# Patient Record
Sex: Female | Born: 1955 | Race: Black or African American | Hispanic: No | Marital: Single | Smoking: Former smoker
Health system: Southern US, Community
[De-identification: ages and names within clinical notes are randomized; demographics above are authoritative.]

## PROBLEM LIST (undated history)

## (undated) DIAGNOSIS — I10 Essential (primary) hypertension: Secondary | ICD-10-CM

## (undated) DIAGNOSIS — J301 Allergic rhinitis due to pollen: Secondary | ICD-10-CM

## (undated) DIAGNOSIS — J4489 Other specified chronic obstructive pulmonary disease: Secondary | ICD-10-CM

## (undated) DIAGNOSIS — E785 Hyperlipidemia, unspecified: Secondary | ICD-10-CM

## (undated) DIAGNOSIS — T7840XA Allergy, unspecified, initial encounter: Secondary | ICD-10-CM

## (undated) DIAGNOSIS — J4550 Severe persistent asthma, uncomplicated: Secondary | ICD-10-CM

## (undated) DIAGNOSIS — K219 Gastro-esophageal reflux disease without esophagitis: Secondary | ICD-10-CM

## (undated) DIAGNOSIS — D649 Anemia, unspecified: Secondary | ICD-10-CM

## (undated) DIAGNOSIS — J449 Chronic obstructive pulmonary disease, unspecified: Secondary | ICD-10-CM

## (undated) HISTORY — DX: Essential (primary) hypertension: I10

## (undated) HISTORY — DX: Allergy, unspecified, initial encounter: T78.40XA

## (undated) HISTORY — DX: Chronic obstructive pulmonary disease, unspecified: J44.9

## (undated) HISTORY — DX: Anemia, unspecified: D64.9

## (undated) HISTORY — DX: Other specified chronic obstructive pulmonary disease: J44.89

## (undated) HISTORY — PX: TIBIA FRACTURE SURGERY: SHX806

## (undated) HISTORY — DX: Gastro-esophageal reflux disease without esophagitis: K21.9

## (undated) HISTORY — DX: Hyperlipidemia, unspecified: E78.5

## (undated) HISTORY — DX: Severe persistent asthma, uncomplicated: J45.50

## (undated) HISTORY — PX: INTUBATION NASOTRACHEAL: SUR735

## (undated) HISTORY — DX: Allergic rhinitis due to pollen: J30.1

## (undated) HISTORY — PX: EMBOLIZATION: SHX1496

## (undated) HISTORY — PX: FIBULA FRACTURE SURGERY: SHX947

---

## 2014-08-14 DIAGNOSIS — S82209A Unspecified fracture of shaft of unspecified tibia, initial encounter for closed fracture: Secondary | ICD-10-CM | POA: Insufficient documentation

## 2014-08-16 DIAGNOSIS — J4540 Moderate persistent asthma, uncomplicated: Secondary | ICD-10-CM | POA: Insufficient documentation

## 2014-08-16 DIAGNOSIS — K219 Gastro-esophageal reflux disease without esophagitis: Secondary | ICD-10-CM | POA: Insufficient documentation

## 2014-08-17 DIAGNOSIS — S82131A Displaced fracture of medial condyle of right tibia, initial encounter for closed fracture: Secondary | ICD-10-CM | POA: Insufficient documentation

## 2014-08-21 DIAGNOSIS — J45909 Unspecified asthma, uncomplicated: Secondary | ICD-10-CM | POA: Insufficient documentation

## 2014-08-21 DIAGNOSIS — S82872A Displaced pilon fracture of left tibia, initial encounter for closed fracture: Secondary | ICD-10-CM | POA: Insufficient documentation

## 2014-11-29 DIAGNOSIS — S82873B Displaced pilon fracture of unspecified tibia, initial encounter for open fracture type I or II: Secondary | ICD-10-CM | POA: Insufficient documentation

## 2014-11-29 DIAGNOSIS — S82202A Unspecified fracture of shaft of left tibia, initial encounter for closed fracture: Secondary | ICD-10-CM | POA: Insufficient documentation

## 2019-05-27 ENCOUNTER — Other Ambulatory Visit: Payer: Self-pay

## 2019-05-27 ENCOUNTER — Ambulatory Visit (INDEPENDENT_AMBULATORY_CARE_PROVIDER_SITE_OTHER): Payer: Medicare Other | Admitting: Internal Medicine

## 2019-05-27 ENCOUNTER — Ambulatory Visit
Admission: RE | Admit: 2019-05-27 | Discharge: 2019-05-27 | Disposition: A | Discharge status: Home / Self Care | Payer: Medicare Other | Source: Ambulatory Visit | Attending: Internal Medicine | Admitting: Internal Medicine

## 2019-05-27 ENCOUNTER — Encounter: Payer: Self-pay | Admitting: Internal Medicine

## 2019-05-27 VITALS — BP 150/90 | HR 62 | Resp 16 | Ht 64.0 in | Wt 172.0 lb

## 2019-05-27 DIAGNOSIS — J449 Chronic obstructive pulmonary disease, unspecified: Secondary | ICD-10-CM | POA: Diagnosis present

## 2019-05-27 DIAGNOSIS — J301 Allergic rhinitis due to pollen: Secondary | ICD-10-CM

## 2019-05-27 DIAGNOSIS — I10 Essential (primary) hypertension: Secondary | ICD-10-CM

## 2019-05-27 DIAGNOSIS — J4550 Severe persistent asthma, uncomplicated: Secondary | ICD-10-CM

## 2019-05-27 NOTE — Patient Instructions (Signed)
Allergic Rhinitis, Adult Allergic rhinitis is an allergic reaction that affects the mucous membrane inside the nose. It causes sneezing, a runny or stuffy nose, and the feeling of mucus going down the back of the throat (postnasal drip). Allergic rhinitis can be mild to severe. There are two types of allergic rhinitis:  Seasonal. This type is also called hay fever. It happens only during certain seasons.  Perennial. This type can happen at any time of the year. What are the causes? This condition happens when the body's defense system (immune system) responds to certain harmless substances called allergens as though they were germs.  Seasonal allergic rhinitis is triggered by pollen, which can come from grasses, trees, and weeds. Perennial allergic rhinitis may be caused by:  House dust mites.  Pet dander.  Mold spores. What are the signs or symptoms? Symptoms of this condition include:  Sneezing.  Runny or stuffy nose (nasal congestion).  Postnasal drip.  Itchy nose.  Tearing of the eyes.  Trouble sleeping.  Daytime sleepiness. How is this diagnosed? This condition may be diagnosed based on:  Your medical history.  A physical exam.  Tests to check for related conditions, such as: ? Asthma. ? Pink eye. ? Ear infection. ? Upper respiratory infection.  Tests to find out which allergens trigger your symptoms. These may include skin or blood tests. How is this treated? There is no cure for this condition, but treatment can help control symptoms. Treatment may include:  Taking medicines that block allergy symptoms, such as antihistamines. Medicine may be given as a shot, nasal spray, or pill.  Avoiding the allergen.  Desensitization. This treatment involves getting ongoing shots until your body becomes less sensitive to the allergen. This treatment may be done if other treatments do not help.  If taking medicine and avoiding the allergen does not work, new, stronger  medicines may be prescribed. Follow these instructions at home:  Find out what you are allergic to. Common allergens include smoke, dust, and pollen.  Avoid the things you are allergic to. These are some things you can do to help avoid allergens: ? Replace carpet with wood, tile, or vinyl flooring. Carpet can trap dander and dust. ? Do not smoke. Do not allow smoking in your home. ? Change your heating and air conditioning filter at least once a month. ? During allergy season:  Keep windows closed as much as possible.  Plan outdoor activities when pollen counts are lowest. This is usually during the evening hours.  When coming indoors, change clothing and shower before sitting on furniture or bedding.  Take over-the-counter and prescription medicines only as told by your health care provider.  Keep all follow-up visits as told by your health care provider. This is important. Contact a health care provider if:  You have a fever.  You develop a persistent cough.  You make whistling sounds when you breathe (you wheeze).  Your symptoms interfere with your normal daily activities. Get help right away if:  You have shortness of breath. Summary  This condition can be managed by taking medicines as directed and avoiding allergens.  Contact your health care provider if you develop a persistent cough or fever.  During allergy season, keep windows closed as much as possible. This information is not intended to replace advice given to you by your health care provider. Make sure you discuss any questions you have with your health care provider. Document Released: 05/28/2001 Document Revised: 08/15/2017 Document Reviewed: 10/10/2016 Elsevier Patient  Education  [employer]. Asthma, Adult  Asthma is a long-term (chronic) condition in which the airways get tight and narrow. The airways are the breathing passages that lead from the nose and mouth down into the lungs. A person with  asthma will have times when symptoms get worse. These are called asthma attacks. They can cause coughing, whistling sounds when you breathe (wheezing), shortness of breath, and chest pain. They can make it hard to breathe. There is no cure for asthma, but medicines and lifestyle changes can help control it. There are many things that can bring on an asthma attack or make asthma symptoms worse (triggers). Common triggers include:  Mold.  Dust.  Cigarette smoke.  Cockroaches.  Things that can cause allergy symptoms (allergens). These include animal skin flakes (dander) and pollen from trees or grass.  Things that pollute the air. These may include household cleaners, wood smoke, smog, or chemical odors.  Cold air, weather changes, and wind.  Crying or laughing hard.  Stress.  Certain medicines or drugs.  Certain foods such as dried fruit, potato chips, and grape juice.  Infections, such as a cold or the flu.  Certain medical conditions or diseases.  Exercise or tiring activities. Asthma may be treated with medicines and by staying away from the things that cause asthma attacks. Types of medicines may include:  Controller medicines. These help prevent asthma symptoms. They are usually taken every day.  Fast-acting reliever or rescue medicines. These quickly relieve asthma symptoms. They are used as needed and provide short-term relief.  Allergy medicines if your attacks are brought on by allergens.  Medicines to help control the body's defense (immune) system. Follow these instructions at home: Avoiding triggers in your home  Change your heating and air conditioning filter often.  Limit your use of fireplaces and wood stoves.  Get rid of pests (such as roaches and mice) and their droppings.  Throw away plants if you see mold on them.  Clean your floors. Dust regularly. Use cleaning products that do not smell.  Have someone vacuum when you are not home. Use a vacuum  cleaner with a HEPA filter if possible.  Replace carpet with wood, tile, or vinyl flooring. Carpet can trap animal skin flakes and dust.  Use allergy-proof pillows, mattress covers, and box spring covers.  Wash bed sheets and blankets every week in hot water. Dry them in a dryer.  Keep your bedroom free of any triggers.  Avoid pets and keep windows closed when things that cause allergy symptoms are in the air.  Use blankets that are made of polyester or cotton.  Clean bathrooms and kitchens with bleach. If possible, have someone repaint the walls in these rooms with mold-resistant paint. Keep out of the rooms that are being cleaned and painted.  Wash your hands often with soap and water. If soap and water are not available, use hand sanitizer.  Do not allow anyone to smoke in your home. General instructions  Take over-the-counter and prescription medicines only as told by your doctor. ? Talk with your doctor if you have questions about how or when to take your medicines. ? Make note if you need to use your medicines more often than usual.  Do not use any products that contain nicotine or tobacco, such as cigarettes and e-cigarettes. If you need help quitting, ask your doctor.  Stay away from secondhand smoke.  Avoid doing things outdoors when allergen counts are high and when air quality is  low.  Wear a ski mask when doing outdoor activities in the winter. The mask should cover your nose and mouth. Exercise indoors on cold days if you can.  Warm up before you exercise. Take time to cool down after exercise.  Use a peak flow meter as told by your doctor. A peak flow meter is a tool that measures how well the lungs are working.  Keep track of the peak flow meter's readings. Write them down.  Follow your asthma action plan. This is a written plan for taking care of your asthma and treating your attacks.  Make sure you get all the shots (vaccines) that your doctor recommends. Ask  your doctor about a flu shot and a pneumonia shot.  Keep all follow-up visits as told by your doctor. This is important. Contact a doctor if:  You have wheezing, shortness of breath, or a cough even while taking medicine to prevent attacks.  The mucus you cough up (sputum) is thicker than usual.  The mucus you cough up changes from clear or white to yellow, green, gray, or bloody.  You have problems from the medicine you are taking, such as: ? A rash. ? Itching. ? Swelling. ? Trouble breathing.  You need reliever medicines more than 2-3 times a week.  Your peak flow reading is still at 50-79% of your personal best after following the action plan for 1 hour.  You have a fever. Get help right away if:  You seem to be worse and are not responding to medicine during an asthma attack.  You are short of breath even at rest.  You get short of breath when doing very little activity.  You have trouble eating, drinking, or talking.  You have chest pain or tightness.  You have a fast heartbeat.  Your lips or fingernails start to turn blue.  You are light-headed or dizzy, or you faint.  Your peak flow is less than 50% of your personal best.  You feel too tired to breathe normally. Summary  Asthma is a long-term (chronic) condition in which the airways get tight and narrow. An asthma attack can make it hard to breathe.  Asthma cannot be cured, but medicines and lifestyle changes can help control it.  Make sure you understand how to avoid triggers and how and when to use your medicines. This information is not intended to replace advice given to you by your health care provider. Make sure you discuss any questions you have with your health care provider. Document Released: 02/19/2008 Document Revised: 11/05/2018 Document Reviewed: 10/07/2016 Elsevier Patient Education  2020 [employer].

## 2019-05-27 NOTE — Progress Notes (Signed)
[facility] [place], [place] [address]  Pulmonary Sleep Medicine   Office Visit Note  Patient Name: Krista Robinson DOB: 14-Jul-1956 MRN [id number]  Date of Service: 05/27/2019  Complaints/HPI: From [place] goes back and forth. She has history of asthma all her life. She has had admissions to the hospital but last admission was in 2019 and had a attack in the hospital. She was on xolair but feels as though it is not working. Has been tested for allergies in the past. Patient was positive but did not get placed on shots.  Patient has a typical attack with wheezing increasing shortness of breath.  Patient states that most of her symptoms are at nighttime.  The patient states that she has not been recently intubated on the ventilator.  She uses her medications as prescribed and states that she was attempted on trilogy but did not tolerate the trilogy.  Denies chest tightness right now no chest pain noted.  She does not have any increase in sputum production noted at this time.  She has not had any hemoptysis.  No fevers no chills.  No recent episodes of pneumonia.  She states that she has been staying indoors mostly because of the COVID-19 and has already noted she lives in [place] as well as [place] but is thinking eventually she will move down here.  She also does admit to having allergy symptoms with rhinitis  ROS  General: (-) fever, (-) chills, (-) night sweats, (-) weakness Skin: (-) rashes, (-) itching,. Eyes: (-) visual changes, (-) redness, (-) itching. Nose and Sinuses: (-) nasal stuffiness or itchiness, (-) postnasal drip, (-) nosebleeds, (-) sinus trouble. Mouth and Throat: (-) sore throat, (-) hoarseness. Neck: (-) swollen glands, (-) enlarged thyroid, (-) neck pain. Respiratory: + cough, (-) bloody sputum, + shortness of breath, + wheezing. Cardiovascular: - ankle swelling, (-) chest pain. Lymphatic: (-) lymph node enlargement. Neurologic: (-)  numbness, (-) tingling. Psychiatric: (-) anxiety, (-) depression   Current Medication: Outpatient Encounter Medications as of 05/27/2019  Medication Sig  . albuterol (VENTOLIN HFA) 108 (90 Base) MCG/ACT inhaler Inhale 2 puffs into the lungs every 6 (six) hours as needed for wheezing or shortness of breath.  . fluticasone-salmeterol (ADVAIR HFA) 115-21 MCG/ACT inhaler Inhale 2 puffs into the lungs 2 (two) times daily.  Marland Kitchen gabapentin (NEURONTIN) 100 MG capsule Take 100 mg by mouth 2 (two) times daily.  Marland Kitchen gabapentin (NEURONTIN) 600 MG tablet Take 600 mg by mouth at bedtime.  Marland Kitchen levocetirizine (XYZAL) 5 MG tablet Take 5 mg by mouth every evening.  . montelukast (SINGULAIR) 10 MG tablet Take 10 mg by mouth at bedtime.  Marland Kitchen omalizumab Arvid Right) 150 MG/ML prefilled syringe Inject into the skin every 14 (fourteen) days. Unknown dosage  . omeprazole (PRILOSEC) 40 MG capsule Take 40 mg by mouth 2 (two) times daily.  . theophylline (THEO-24) 400 MG 24 hr capsule Take 400 mg by mouth daily.   No facility-administered encounter medications on file as of 05/27/2019.     Surgical History: Past Surgical History:  Procedure Laterality Date  . CESAREAN SECTION    . INTUBATION NASOTRACHEAL     1991 and 1995    Medical History: Past Medical History:  Diagnosis Date  . Allergy   . Anemia   . GERD (gastroesophageal reflux disease)   . Hyperlipidemia     Family History: Family History  Problem Relation Age of Onset  . Uterine cancer Mother   .  Pancreatic cancer Father     Social History: Social History   Socioeconomic History  . Marital status: Single    Spouse name: Not on file  . Number of children: Not on file  . Years of education: Not on file  . Highest education level: Not on file  Occupational History  . Not on file  Social Needs  . Financial resource strain: Not on file  . Food insecurity    Worry: Not on file    Inability: Not on file  . Transportation needs    Medical: Not on  file    Non-medical: Not on file  Tobacco Use  . Smoking status: Former Research scientist (life sciences)  . Smokeless tobacco: Former Network engineer and Sexual Activity  . Alcohol use: Yes    Alcohol/week: 1.0 standard drinks    Types: 1 Glasses of wine per week    Comment: every night   . Drug use: Never  . Sexual activity: Not on file  Lifestyle  . Physical activity    Days per week: Not on file    Minutes per session: Not on file  . Stress: Not on file  Relationships  . Social Herbalist on phone: Not on file    Gets together: Not on file    Attends religious service: Not on file    Active member of club or organization: Not on file    Attends meetings of clubs or organizations: Not on file    Relationship status: Not on file  . Intimate partner violence    Fear of current or ex partner: Not on file    Emotionally abused: Not on file    Physically abused: Not on file    Forced sexual activity: Not on file  Other Topics Concern  . Not on file  Social History Narrative  . Not on file    Vital Signs: Blood pressure (!) 150/90, pulse 62, resp. rate 16, height 5\' 4"  (1.626 m), weight 172 lb (78 kg), SpO2 96 %.  Examination: General Appearance: The patient is well-developed, well-nourished, and in no distress. Skin: Gross inspection of skin unremarkable. Head: normocephalic, no gross deformities. Eyes: no gross deformities noted. ENT: ears appear grossly normal no exudates. Neck: Supple. No thyromegaly. No LAD. Respiratory: no rhonchi noted. Cardiovascular: Normal S1 and S2 without murmur or rub. Extremities: No cyanosis. pulses are equal. Neurologic: Alert and oriented. No involuntary movements.  LABS: No results found for this or any previous visit (from the past 2160 hour(s)).  Radiology: Patient was never admitted.  No results found.  No results found.    Assessment and Plan: Patient Active Problem List   Diagnosis Date Noted  . Asthma, chronic, severe persistent,  uncomplicated   . Seasonal allergic rhinitis due to pollen   . Obstructive chronic bronchitis without exacerbation ([place])   . Essential hypertension     1. Chronic persistant asthma appears to be not well controlled I have discussed with her the treatment plan and she is under agreement.  I did give her a sample of Anoro to try she is basically missing an anticholinergic which can be added.  The other possibilities are also evaluating her for biologic therapy. 2. Allergic Rhinitis she will need to be tested for allergies she currently has a previously documented history of allergies but is not on any allergy shots.  She does have seasonal symptoms.  I we had and ordered the tests 3. COPD she also likely does  have a fixed obstructive defect with her years of having asthma which probably has transitioned into obstructive lung disease. 4. High Blood Pressure no need to see primary care physician this will be set up for her  General Counseling: I have discussed the findings of the evaluation and examination with [name].  I have also discussed any further diagnostic evaluation thatmay be needed or ordered today. Jniah verbalizes understanding of the findings of todays visit. We also reviewed her medications today and discussed drug interactions and side effects including but not limited excessive drowsiness and altered mental states. We also discussed that there is always a risk not just to her but also people around her. she has been encouraged to call the office with any questions or concerns that should arise related to todays visit.    Time spent: 45 minutes  I have personally obtained a history, examined the patient, evaluated laboratory and imaging results, formulated the assessment and plan and placed orders.    Allyne Gee, MD [facility] Pulmonary and Critical Care Sleep medicine

## 2019-05-31 ENCOUNTER — Encounter: Payer: Self-pay | Admitting: Internal Medicine

## 2019-05-31 DIAGNOSIS — J301 Allergic rhinitis due to pollen: Secondary | ICD-10-CM | POA: Insufficient documentation

## 2019-05-31 DIAGNOSIS — J4550 Severe persistent asthma, uncomplicated: Secondary | ICD-10-CM | POA: Insufficient documentation

## 2019-05-31 DIAGNOSIS — J449 Chronic obstructive pulmonary disease, unspecified: Secondary | ICD-10-CM | POA: Insufficient documentation

## 2019-05-31 DIAGNOSIS — I10 Essential (primary) hypertension: Secondary | ICD-10-CM | POA: Insufficient documentation

## 2019-06-04 LAB — ALLERGENS W/TOTAL IGE AREA 8
Alternaria Alternata IgE: <0.1 kU/L
Amer Sycamore IgE Qn: 0.24 kU/L — AB
Aspergillus Fumigatus IgE: <0.1 kU/L
Bermuda Grass IgE: <0.1 kU/L
Cat Dander IgE: 6.23 kU/L — AB
Cedar, Mountain IgE: 0.23 kU/L — AB
Cladosporium Herbarum IgE: <0.1 kU/L
Cockroach, German IgE: 2.7 kU/L — AB
Cottonwood IgE: <0.1 kU/L
D Farinae IgE: >100 kU/L — AB
D Pteronyssinus IgE: >100 kU/L — AB
Dog Dander IgE: 30.9 kU/L — AB
Elm, American IgE: 0.23 kU/L — AB
IgE (Immunoglobulin E), Serum: 1004 IU/mL — ABNORMAL HIGH (ref 6–495)
Maple/Box Elder IgE: <0.1 kU/L
Mouse Urine IgE: 18.3 kU/L — AB
Oak, White IgE: 0.41 kU/L — AB
Pecan, Hickory IgE: 0.15 kU/L — AB
Penicillium Chrysogen IgE: 0.29 kU/L — AB
Pigweed, Rough IgE: 0.29 kU/L — AB
Ragweed, Short IgE: 24.3 kU/L — AB
T010-IgE Walnut: 0.26 kU/L — AB
T015-IgE Ash, White: <0.1 kU/L
Timothy Grass IgE: <0.1 kU/L
W011-IgE Thistle, Russian: <0.1 kU/L
W016-IgE Rough Marshelder: 1.55 kU/L — AB
White Mulberry IgE: 0.24 kU/L — AB

## 2019-06-04 LAB — ALLERGENS W/TOTAL IGE AREA 2
Common Silver Birch IgE: <0.1 kU/L
Johnson Grass IgE: 0.1 kU/L — AB
Sheep Sorrel IgE Qn: <0.1 kU/L

## 2019-06-07 ENCOUNTER — Other Ambulatory Visit: Payer: Self-pay

## 2019-06-07 MED ORDER — MONTELUKAST SODIUM 10 MG PO TABS: 10.0000 mg | ORAL_TABLET | Freq: Every day | ORAL | 3 refills | Status: DC

## 2019-06-09 ENCOUNTER — Ambulatory Visit: Payer: Medicare Other | Admitting: Internal Medicine

## 2019-06-09 ENCOUNTER — Other Ambulatory Visit: Payer: Self-pay

## 2019-06-09 DIAGNOSIS — J4550 Severe persistent asthma, uncomplicated: Secondary | ICD-10-CM

## 2019-06-09 LAB — PULMONARY FUNCTION TEST

## 2019-06-15 NOTE — Procedures (Signed)
[facility] [place], [id number]  DATE OF SERVICE: June 09, 2019  Complete Pulmonary Function Testing Interpretation:  FINDINGS:  The forced vital capacity is normal.  The FEV1 is mildly decreased.  FEV1 FVC ratio is mildly decreased.  Postbronchodilator there is no significant improvement in the FEV1 clinical improvement may still occur in the absence of spirometric improvement.  Total lung capacity is normal residual volume is increased residual volume total lung capacity ratio is increased thoracic gas volume is increased.  The DLCO is mildly decreased.  DLCO corrected for alveolar volume is normal.  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  Allyne Gee, MD [facility] Pulmonary Critical Care Medicine Sleep Medicine

## 2019-06-18 ENCOUNTER — Encounter: Payer: Self-pay | Admitting: Internal Medicine

## 2019-07-05 ENCOUNTER — Other Ambulatory Visit: Payer: Self-pay

## 2019-07-05 ENCOUNTER — Ambulatory Visit: Payer: Medicare Other | Admitting: Internal Medicine

## 2019-07-05 ENCOUNTER — Encounter: Payer: Self-pay | Admitting: Internal Medicine

## 2019-07-05 VITALS — BP 128/84 | HR 71 | Temp 97.3°F | Resp 16 | Ht 64.0 in | Wt 169.0 lb

## 2019-07-05 DIAGNOSIS — J449 Chronic obstructive pulmonary disease, unspecified: Secondary | ICD-10-CM

## 2019-07-05 DIAGNOSIS — I10 Essential (primary) hypertension: Secondary | ICD-10-CM | POA: Diagnosis not present

## 2019-07-05 DIAGNOSIS — J4550 Severe persistent asthma, uncomplicated: Secondary | ICD-10-CM | POA: Diagnosis not present

## 2019-07-05 DIAGNOSIS — J301 Allergic rhinitis due to pollen: Secondary | ICD-10-CM

## 2019-07-05 MED ORDER — TUDORZA PRESSAIR 400 MCG/ACT IN AEPB: INHALATION_SPRAY | RESPIRATORY_TRACT | 3 refills | Status: DC

## 2019-07-05 MED ORDER — ANORO ELLIPTA 62.5-25 MCG/INH IN AEPB: 1.0000 | INHALATION_SPRAY | Freq: Every day | RESPIRATORY_TRACT | 3 refills | Status: DC

## 2019-07-05 MED ORDER — ALBUTEROL SULFATE HFA 108 (90 BASE) MCG/ACT IN AERS: 2.0000 | INHALATION_SPRAY | Freq: Four times a day (QID) | RESPIRATORY_TRACT | 3 refills | Status: DC | PRN

## 2019-07-05 NOTE — Progress Notes (Signed)
[facility] [place], [place] [address]  Pulmonary Sleep Medicine   Office Visit Note  Patient Name: Krista Robinson DOB: 07-05-1956 MRN [id number]  Date of Service: 07/05/2019  Complaints/HPI: She is on anoro and she feels that she is doing much better. She is requesting a script now.  She states her shortness of breath improved denies having any cough or congestion at this time.  No chest pain is noted at this time.  As already noted patient does better with the new inhalers.  ROS  General: (-) fever, (-) chills, (-) night sweats, (-) weakness Skin: (-) rashes, (-) itching,. Eyes: (-) visual changes, (-) redness, (-) itching. Nose and Sinuses: (-) nasal stuffiness or itchiness, (-) postnasal drip, (-) nosebleeds, (-) sinus trouble. Mouth and Throat: (-) sore throat, (-) hoarseness. Neck: (-) swollen glands, (-) enlarged thyroid, (-) neck pain. Respiratory: - cough, (-) bloody sputum, - shortness of breath, - wheezing. Cardiovascular: - ankle swelling, (-) chest pain. Lymphatic: (-) lymph node enlargement. Neurologic: (-) numbness, (-) tingling. Psychiatric: (-) anxiety, (-) depression   Current Medication: Outpatient Encounter Medications as of 07/05/2019  Medication Sig  . albuterol (VENTOLIN HFA) 108 (90 Base) MCG/ACT inhaler Inhale 2 puffs into the lungs every 6 (six) hours as needed for wheezing or shortness of breath.  . fluticasone-salmeterol (ADVAIR HFA) 115-21 MCG/ACT inhaler Inhale 2 puffs into the lungs 2 (two) times daily.  Marland Kitchen gabapentin (NEURONTIN) 100 MG capsule Take 100 mg by mouth 2 (two) times daily.  Marland Kitchen gabapentin (NEURONTIN) 600 MG tablet Take 600 mg by mouth at bedtime.  Marland Kitchen levocetirizine (XYZAL) 5 MG tablet Take 5 mg by mouth every evening.  . montelukast (SINGULAIR) 10 MG tablet Take 1 tablet (10 mg total) by mouth at bedtime.  Marland Kitchen omalizumab Arvid Right) 150 MG/ML prefilled syringe Inject into the skin every 14 (fourteen) days. Unknown  dosage  . omeprazole (PRILOSEC) 40 MG capsule Take 40 mg by mouth 2 (two) times daily.  . theophylline (THEO-24) 400 MG 24 hr capsule Take 400 mg by mouth daily.   No facility-administered encounter medications on file as of 07/05/2019.     Surgical History: Past Surgical History:  Procedure Laterality Date  . CESAREAN SECTION    . INTUBATION NASOTRACHEAL     1991 and 1995    Medical History: Past Medical History:  Diagnosis Date  . Allergy   . Anemia   . Asthma, chronic, severe persistent, uncomplicated   . Essential hypertension   . GERD (gastroesophageal reflux disease)   . Hyperlipidemia   . Obstructive chronic bronchitis without exacerbation ([place])   . Seasonal allergic rhinitis due to pollen     Family History: Family History  Problem Relation Age of Onset  . Uterine cancer Mother   . Pancreatic cancer Father     Social History: Social History   Socioeconomic History  . Marital status: Single    Spouse name: Not on file  . Number of children: Not on file  . Years of education: Not on file  . Highest education level: Not on file  Occupational History  . Not on file  Social Needs  . Financial resource strain: Not on file  . Food insecurity    Worry: Not on file    Inability: Not on file  . Transportation needs    Medical: Not on file    Non-medical: Not on file  Tobacco Use  . Smoking status: Former Research scientist (life sciences)  . Smokeless tobacco: Former Systems developer  Substance and Sexual Activity  . Alcohol use: Yes    Alcohol/week: 1.0 standard drinks    Types: 1 Glasses of wine per week    Comment: every night   . Drug use: Never  . Sexual activity: Not on file  Lifestyle  . Physical activity    Days per week: Not on file    Minutes per session: Not on file  . Stress: Not on file  Relationships  . Social Herbalist on phone: Not on file    Gets together: Not on file    Attends religious service: Not on file    Active member of club or organization: Not on  file    Attends meetings of clubs or organizations: Not on file    Relationship status: Not on file  . Intimate partner violence    Fear of current or ex partner: Not on file    Emotionally abused: Not on file    Physically abused: Not on file    Forced sexual activity: Not on file  Other Topics Concern  . Not on file  Social History Narrative  . Not on file    Vital Signs: Blood pressure 128/84, pulse 71, temperature (!) 97.3 F (36.3 C), resp. rate 16, height 5\' 4"  (1.626 m), weight 169 lb (76.7 kg), SpO2 97 %.  Examination: General Appearance: The patient is well-developed, well-nourished, and in no distress. Skin: Gross inspection of skin unremarkable. Head: normocephalic, no gross deformities. Eyes: no gross deformities noted. ENT: ears appear grossly normal no exudates. Neck: Supple. No thyromegaly. No LAD. Respiratory: no rhonchi noted. Cardiovascular: Normal S1 and S2 without murmur or rub. Extremities: No cyanosis. pulses are equal. Neurologic: Alert and oriented. No involuntary movements.  LABS: Recent Results (from the past 2160 hour(s))  Allergens w/Total IgE Area 2     Status: Abnormal   Collection Time: 06/01/19 11:15 AM  Result Value Ref Range   Johnson Grass IgE 0.10 (A) Class 0/I kU/L   Common Silver Wendee Copp IgE <0.10 Class 0 kU/L   Sheep Sorrel IgE Qn <0.10 Class 0 kU/L  Allergens w/Total IgE Area 8     Status: Abnormal   Collection Time: 06/01/19 11:15 AM  Result Value Ref Range   Class Description Comment     Comment:     Levels of Specific IgE       Class  Description of Class     ---------------------------  -----  --------------------                    < 0.10         0         Negative            0.10 -    0.31         0/I       Equivocal/Low            0.32 -    0.55         I         Low            0.56 -    1.40         II        Moderate            1.41 -    3.90         III       High  3.91 -   19.00         IV        Very High            19.01 -  100.00         V         Very High                   >100.00         VI        Very High    IgE (Immunoglobulin E), Serum 1,004 (H) 6 - 495 IU/mL   D Pteronyssinus IgE >100 (A) Class VI kU/L   D Farinae IgE >100 (A) Class VI kU/L   Cat Dander IgE 6.23 (A) Class IV kU/L   Dog Dander IgE 30.90 (A) Class V kU/L   [place] Grass IgE <0.10 Class 0 kU/L   Timothy Grass IgE <0.10 Class 0 kU/L   Cockroach, German IgE 2.70 (A) Class III kU/L   Penicillium Chrysogen IgE 0.29 (A) Class 0/I kU/L   Cladosporium Herbarum IgE <0.10 Class 0 kU/L   Aspergillus Fumigatus IgE <0.10 Class 0 kU/L   Alternaria Alternata IgE <0.10 Class 0 kU/L   Maple/Box Elder IgE <0.10 Class 0 kU/L   Cedar, [place] IgE 0.23 (A) Class 0/I kU/L   Oak, White IgE 0.41 (A) Class I kU/L   Elm, American IgE 0.23 (A) Class 0/I kU/L   Amer Sycamore IgE Qn 0.24 (A) Class 0/I kU/L   Cottonwood IgE <0.10 Class 0 kU/L   T015-IgE Ash, White <0.10 Class 0 kU/L   T010-IgE Walnut 0.26 (A) Class 0/I kU/L   Pecan, Hickory IgE 0.15 (A) Class 0/I kU/L   White Mulberry IgE 0.24 (A) Class 0/I kU/L   Ragweed, Short IgE 24.30 (A) Class V kU/L   W011-IgE Thistle, [place] <0.10 Class 0 kU/L   Pigweed, Rough IgE 0.29 (A) Class 0/I kU/L   W016-IgE Rough Marshelder 1.55 (A) Class III kU/L   Mouse Urine IgE 18.30 (A) Class IV kU/L    Radiology: Dg Chest 2 View  Result Date: 05/27/2019 CLINICAL DATA:  Shortness of breath and chest tightness for 3 months. EXAM: CHEST - 2 VIEW COMPARISON:  None. FINDINGS: Mildly enlarged cardiac silhouette. Tortuosity of the aorta. There is no evidence of focal airspace consolidation, pleural effusion or pneumothorax. Osseous structures are without acute abnormality. Soft tissues are grossly normal. IMPRESSION: 1. Mildly enlarged cardiac silhouette. 2. Tortuosity of the aorta. 3. No focal consolidation. Electronically Signed   By: Fidela Salisbury M.D.   On: 05/27/2019 16:38    No results  found.  No results found.    Assessment and Plan: Patient Active Problem List   Diagnosis Date Noted  . Asthma, chronic, severe persistent, uncomplicated   . Seasonal allergic rhinitis due to pollen   . Obstructive chronic bronchitis without exacerbation ([place])   . Essential hypertension     1. Ch obstructive asthma we will continue with current treatment regimen.  Seems to be getting benefit as far as MRI is concerned we will continue with supportive care 2. Allergic Rhinitis at baseline continue with present management 3. COPD as above inhaler regimen was adjusted 4. HTN controlled  General Counseling: I have discussed the findings of the evaluation and examination with [name].  I have also discussed any further diagnostic evaluation thatmay be needed or ordered today. Heddy verbalizes understanding of the findings of todays visit. We also  reviewed her medications today and discussed drug interactions and side effects including but not limited excessive drowsiness and altered mental states. We also discussed that there is always a risk not just to her but also people around her. she has been encouraged to call the office with any questions or concerns that should arise related to todays visit.    Time spent: 30min  I have personally obtained a history, examined the patient, evaluated laboratory and imaging results, formulated the assessment and plan and placed orders.    Allyne Gee, MD [facility] Pulmonary and Critical Care Sleep medicine

## 2019-07-05 NOTE — Patient Instructions (Signed)
Asthma, Adult ° °Asthma is a long-term (chronic) condition in which the airways get tight and narrow. The airways are the breathing passages that lead from the nose and mouth down into the lungs. A person with asthma will have times when symptoms get worse. These are called asthma attacks. They can cause coughing, whistling sounds when you breathe (wheezing), shortness of breath, and chest pain. They can make it hard to breathe. There is no cure for asthma, but medicines and lifestyle changes can help control it. °There are many things that can bring on an asthma attack or make asthma symptoms worse (triggers). Common triggers include: °· Mold. °· Dust. °· Cigarette smoke. °· Cockroaches. °· Things that can cause allergy symptoms (allergens). These include animal skin flakes (dander) and pollen from trees or grass. °· Things that pollute the air. These may include household cleaners, wood smoke, smog, or chemical odors. °· Cold air, weather changes, and wind. °· Crying or laughing hard. °· Stress. °· Certain medicines or drugs. °· Certain foods such as dried fruit, potato chips, and grape juice. °· Infections, such as a cold or the flu. °· Certain medical conditions or diseases. °· Exercise or tiring activities. °Asthma may be treated with medicines and by staying away from the things that cause asthma attacks. Types of medicines may include: °· Controller medicines. These help prevent asthma symptoms. They are usually taken every day. °· Fast-acting reliever or rescue medicines. These quickly relieve asthma symptoms. They are used as needed and provide short-term relief. °· Allergy medicines if your attacks are brought on by allergens. °· Medicines to help control the body's defense (immune) system. °Follow these instructions at home: °Avoiding triggers in your home °· Change your heating and air conditioning filter often. °· Limit your use of fireplaces and wood stoves. °· Get rid of pests (such as roaches and  mice) and their droppings. °· Throw away plants if you see mold on them. °· Clean your floors. Dust regularly. Use cleaning products that do not smell. °· Have someone vacuum when you are not home. Use a vacuum cleaner with a HEPA filter if possible. °· Replace carpet with wood, tile, or vinyl flooring. Carpet can trap animal skin flakes and dust. °· Use allergy-proof pillows, mattress covers, and box spring covers. °· Wash bed sheets and blankets every week in hot water. Dry them in a dryer. °· Keep your bedroom free of any triggers. °· Avoid pets and keep windows closed when things that cause allergy symptoms are in the air. °· Use blankets that are made of polyester or cotton. °· Clean bathrooms and kitchens with bleach. If possible, have someone repaint the walls in these rooms with mold-resistant paint. Keep out of the rooms that are being cleaned and painted. °· Wash your hands often with soap and water. If soap and water are not available, use hand sanitizer. °· Do not allow anyone to smoke in your home. °General instructions °· Take over-the-counter and prescription medicines only as told by your doctor. °? Talk with your doctor if you have questions about how or when to take your medicines. °? Make note if you need to use your medicines more often than usual. °· Do not use any products that contain nicotine or tobacco, such as cigarettes and e-cigarettes. If you need help quitting, ask your doctor. °· Stay away from secondhand smoke. °· Avoid doing things outdoors when allergen counts are high and when air quality is low. °· Wear a ski mask   when doing outdoor activities in the winter. The mask should cover your nose and mouth. Exercise indoors on cold days if you can. °· Warm up before you exercise. Take time to cool down after exercise. °· Use a peak flow meter as told by your doctor. A peak flow meter is a tool that measures how well the lungs are working. °· Keep track of the peak flow meter's readings.  Write them down. °· Follow your asthma action plan. This is a written plan for taking care of your asthma and treating your attacks. °· Make sure you get all the shots (vaccines) that your doctor recommends. Ask your doctor about a flu shot and a pneumonia shot. °· Keep all follow-up visits as told by your doctor. This is important. °Contact a doctor if: °· You have wheezing, shortness of breath, or a cough even while taking medicine to prevent attacks. °· The mucus you cough up (sputum) is thicker than usual. °· The mucus you cough up changes from clear or white to yellow, green, gray, or bloody. °· You have problems from the medicine you are taking, such as: °? A rash. °? Itching. °? Swelling. °? Trouble breathing. °· You need reliever medicines more than 2-3 times a week. °· Your peak flow reading is still at 50-79% of your personal best after following the action plan for 1 hour. °· You have a fever. °Get help right away if: °· You seem to be worse and are not responding to medicine during an asthma attack. °· You are short of breath even at rest. °· You get short of breath when doing very little activity. °· You have trouble eating, drinking, or talking. °· You have chest pain or tightness. °· You have a fast heartbeat. °· Your lips or fingernails start to turn blue. °· You are light-headed or dizzy, or you faint. °· Your peak flow is less than 50% of your personal best. °· You feel too tired to breathe normally. °Summary °· Asthma is a long-term (chronic) condition in which the airways get tight and narrow. An asthma attack can make it hard to breathe. °· Asthma cannot be cured, but medicines and lifestyle changes can help control it. °· Make sure you understand how to avoid triggers and how and when to use your medicines. °This information is not intended to replace advice given to you by your health care provider. Make sure you discuss any questions you have with your health care provider. °Document  Released: 02/19/2008 Document Revised: 11/05/2018 Document Reviewed: 10/07/2016 °Elsevier Patient Education © 2020 Elsevier Inc. ° °

## 2019-10-11 ENCOUNTER — Ambulatory Visit: Payer: Medicare Other | Admitting: Internal Medicine

## 2019-10-11 ENCOUNTER — Encounter: Payer: Self-pay | Admitting: Internal Medicine

## 2019-10-11 VITALS — Ht 64.0 in | Wt 163.0 lb

## 2019-10-11 DIAGNOSIS — J449 Chronic obstructive pulmonary disease, unspecified: Secondary | ICD-10-CM | POA: Diagnosis not present

## 2019-10-11 DIAGNOSIS — I10 Essential (primary) hypertension: Secondary | ICD-10-CM

## 2019-10-11 DIAGNOSIS — J4550 Severe persistent asthma, uncomplicated: Secondary | ICD-10-CM

## 2019-10-11 MED ORDER — THEOPHYLLINE ER 400 MG PO CP24: 400.0000 mg | ORAL_CAPSULE | Freq: Every day | ORAL | 1 refills | Status: DC

## 2019-10-11 MED ORDER — MONTELUKAST SODIUM 10 MG PO TABS: 10.0000 mg | ORAL_TABLET | Freq: Every day | ORAL | 2 refills | Status: AC

## 2019-10-11 NOTE — Progress Notes (Signed)
[facility] [place], [place] [address]  Internal MEDICINE  Telephone Visit  Patient Name: Krista Robinson  [id number]  [id number]  Date of Service: 10/11/2019  I connected with the patient at 1118 by telephone and verified the patients identity using two identifiers.   I discussed the limitations, risks, security and privacy concerns of performing an evaluation and management service by telephone and the availability of in person appointments. I also discussed with the patient that there may be a patient responsible charge related to the service.  The patient expressed understanding and agrees to proceed.    Chief Complaint  Patient presents with  . Telephone Assessment    WOULD LIKE TO ASK QUESTION ABOUT GETTING VACCINE  . Telephone Screen  . Asthma  . Medication Refill    SINGULAIR  . Allergies    HPI  PT seen via video.  She is following up on Asthma, allergies and copd.  She reports overall she is doing well.  Her allergy symptoms have increased, however she has been out of her Singulair.  She is requesting refill at this time. She denies any further complaints at this time. She does have questions about getting covid vaccine due to her allergies. I addressed this concerns with her today.    Current Medication: Outpatient Encounter Medications as of 10/11/2019  Medication Sig  . Aclidinium Bromide (TUDORZA PRESSAIR) 400 MCG/ACT AEPB Take 1 inhalation at once a day  . albuterol (VENTOLIN HFA) 108 (90 Base) MCG/ACT inhaler Inhale 2 puffs into the lungs every 6 (six) hours as needed for wheezing or shortness of breath.  . fluticasone-salmeterol (ADVAIR HFA) 115-21 MCG/ACT inhaler Inhale 2 puffs into the lungs 2 (two) times daily.  Marland Kitchen gabapentin (NEURONTIN) 100 MG capsule Take 100 mg by mouth 2 (two) times daily.  Marland Kitchen gabapentin (NEURONTIN) 600 MG tablet Take 600 mg by mouth at bedtime.  Marland Kitchen levocetirizine (XYZAL) 5 MG tablet Take 5 mg by mouth every evening.  .  montelukast (SINGULAIR) 10 MG tablet Take 1 tablet (10 mg total) by mouth at bedtime.  Marland Kitchen omalizumab Arvid Right) 150 MG/ML prefilled syringe Inject into the skin every 14 (fourteen) days. Unknown dosage  . omeprazole (PRILOSEC) 40 MG capsule Take 40 mg by mouth 2 (two) times daily.  . theophylline (THEO-24) 400 MG 24 hr capsule Take 1 capsule (400 mg total) by mouth daily.  . [DISCONTINUED] montelukast (SINGULAIR) 10 MG tablet Take 1 tablet (10 mg total) by mouth at bedtime.  . [DISCONTINUED] theophylline (THEO-24) 400 MG 24 hr capsule Take 400 mg by mouth daily.   No facility-administered encounter medications on file as of 10/11/2019.    Surgical History: Past Surgical History:  Procedure Laterality Date  . CESAREAN SECTION    . INTUBATION NASOTRACHEAL     1991 and 1995    Medical History: Past Medical History:  Diagnosis Date  . Allergy   . Anemia   . Asthma, chronic, severe persistent, uncomplicated   . Essential hypertension   . GERD (gastroesophageal reflux disease)   . Hyperlipidemia   . Obstructive chronic bronchitis without exacerbation ([place])   . Seasonal allergic rhinitis due to pollen     Family History: Family History  Problem Relation Age of Onset  . Uterine cancer Mother   . Pancreatic cancer Father     Social History   Socioeconomic History  . Marital status: Single    Spouse name: Not on file  . Number of children: Not on file  .  Years of education: Not on file  . Highest education level: Not on file  Occupational History  . Not on file  Tobacco Use  . Smoking status: Former Smoker    Types: Cigarettes  . Smokeless tobacco: Never Used  . Tobacco comment: OVER 20 YEAR- REPORTED ON 10/11/19  Substance and Sexual Activity  . Alcohol use: Yes    Alcohol/week: 1.0 standard drinks    Types: 1 Glasses of wine per week    Comment: ONCE IN A WHILE   . Drug use: Never  . Sexual activity: Not on file  Other Topics Concern  . Not on file  Social History  Narrative  . Not on file   Social Determinants of Health   Financial Resource Strain:   . Difficulty of Paying Living Expenses: Not on file  Food Insecurity:   . Worried About Charity fundraiser in the Last Year: Not on file  . Ran Out of Food in the Last Year: Not on file  Transportation Needs:   . Lack of Transportation (Medical): Not on file  . Lack of Transportation (Non-Medical): Not on file  Physical Activity:   . Days of Exercise per Week: Not on file  . Minutes of Exercise per Session: Not on file  Stress:   . Feeling of Stress : Not on file  Social Connections:   . Frequency of Communication with Friends and Family: Not on file  . Frequency of Social Gatherings with Friends and Family: Not on file  . Attends Religious Services: Not on file  . Active Member of Clubs or Organizations: Not on file  . Attends Archivist Meetings: Not on file  . Marital Status: Not on file  Intimate Partner Violence:   . Fear of Current or Ex-Partner: Not on file  . Emotionally Abused: Not on file  . Physically Abused: Not on file  . Sexually Abused: Not on file      Review of Systems  Constitutional: Negative for chills, fatigue and unexpected weight change.  HENT: Negative for congestion, rhinorrhea, sneezing and sore throat.   Eyes: Negative for photophobia, pain and redness.  Respiratory: Negative for cough, chest tightness and shortness of breath.   Cardiovascular: Negative for chest pain and palpitations.  Gastrointestinal: Negative for abdominal pain, constipation, diarrhea, nausea and vomiting.  Endocrine: Negative.   Genitourinary: Negative for dysuria and frequency.  Musculoskeletal: Negative for arthralgias, back pain, joint swelling and neck pain.  Skin: Negative for rash.  Allergic/Immunologic: Negative.   Neurological: Negative for tremors and numbness.  Hematological: Negative for adenopathy. Does not bruise/bleed easily.  Psychiatric/Behavioral: Negative  for behavioral problems and sleep disturbance. The patient is not nervous/anxious.     Vital Signs: Ht 5\' 4"  (1.626 m)   Wt 163 lb (73.9 kg)   BMI 27.98 kg/m    Observation/Objective:  Well appearing, NAD noted.   Assessment/Plan: 1. Chronic obstructive pulmonary disease, unspecified COPD type ([place]) Overall stable, good control of symptoms. Continue inhalers as discussed.  - theophylline (THEO-24) 400 MG 24 hr capsule; Take 1 capsule (400 mg total) by mouth daily.  Dispense: 30 capsule; Refill: 1  2. Asthma, chronic, severe persistent, uncomplicated Overall stable, only takes Theo as needed.  Deferred theo level at this time. Has not had since November.   3. Essential hypertension Stable, continue present management.  General Counseling: hiley hillstrom understanding of the findings of today's phone visit and agrees with plan of treatment. I have discussed any further  diagnostic evaluation that may be needed or ordered today. We also reviewed her medications today. she has been encouraged to call the office with any questions or concerns that should arise related to todays visit.    No orders of the defined types were placed in this encounter.   Meds ordered this encounter  Medications  . montelukast (SINGULAIR) 10 MG tablet    Sig: Take 1 tablet (10 mg total) by mouth at bedtime.    Dispense:  90 tablet    Refill:  2  . theophylline (THEO-24) 400 MG 24 hr capsule    Sig: Take 1 capsule (400 mg total) by mouth daily.    Dispense:  30 capsule    Refill:  1    Time spent: 15 Minutes  This patient was seen by Orson Gear AGNP-C in Collaboration with Dr. Devona Konig as a part of collaborative care agreement.   Orson Gear AGNP-C Pulmonary medicine

## 2019-12-05 ENCOUNTER — Ambulatory Visit: Payer: Medicare Other | Attending: Internal Medicine

## 2019-12-05 DIAGNOSIS — Z23 Encounter for immunization: Secondary | ICD-10-CM

## 2019-12-05 NOTE — Progress Notes (Signed)
   Covid-19 Vaccination Clinic  Name:  Krista Robinson    MRN: [id number] DOB: Feb 14, 1956  12/05/2019  [name] was observed post Covid-19 immunization for 15 minutes without incident. She was provided with Vaccine Information Sheet and instruction to access the V-Safe system.   [name] was instructed to call 911 with any severe reactions post vaccine: Marland Kitchen Difficulty breathing  . Swelling of face and throat  . A fast heartbeat  . A bad rash all over body  . Dizziness and weakness   Immunizations Administered    Name Date Dose VIS Date Route   Pfizer COVID-19 Vaccine 12/05/2019  3:46 PM 0.3 mL 08/27/2019 Intramuscular   Manufacturer: [place]   Lot: [id number]   [place]: [id number]

## 2019-12-24 ENCOUNTER — Other Ambulatory Visit: Payer: Self-pay

## 2019-12-24 MED ORDER — ALBUTEROL SULFATE HFA 108 (90 BASE) MCG/ACT IN AERS: 2.0000 | INHALATION_SPRAY | Freq: Four times a day (QID) | RESPIRATORY_TRACT | 3 refills | Status: DC | PRN

## 2019-12-26 ENCOUNTER — Ambulatory Visit: Payer: Medicare Other | Attending: Internal Medicine

## 2019-12-26 DIAGNOSIS — Z23 Encounter for immunization: Secondary | ICD-10-CM

## 2019-12-26 NOTE — Progress Notes (Signed)
   Covid-19 Vaccination Clinic  Name:  Bismah Weintraub    MRN: [id number] DOB: Jun 13, 1956  12/26/2019  Ms. Sorrells was observed post Covid-19 immunization for 15 minutes   without incident. She was provided with Vaccine Information Sheet and instruction to access the V-Safe system.   Ms. Jenniges was instructed to call 911 with any severe reactions post vaccine: Marland Kitchen Difficulty breathing  . Swelling of face and throat  . A fast heartbeat  . A bad rash all over body  . Dizziness and weakness   Immunizations Administered    Name Date Dose VIS Date Route   Pfizer COVID-19 Vaccine 12/26/2019  3:42 PM 0.3 mL 08/27/2019 Intramuscular   Manufacturer: [place]   Lot: [id number]   [place]: [id number]

## 2020-01-17 ENCOUNTER — Ambulatory Visit: Payer: Medicare Other | Admitting: Internal Medicine

## 2020-01-17 ENCOUNTER — Other Ambulatory Visit: Payer: Self-pay

## 2020-01-17 VITALS — BP 129/78 | HR 74 | Temp 97.2°F | Resp 16 | Ht 64.0 in | Wt 158.0 lb

## 2020-01-17 DIAGNOSIS — J449 Chronic obstructive pulmonary disease, unspecified: Secondary | ICD-10-CM

## 2020-01-17 DIAGNOSIS — I10 Essential (primary) hypertension: Secondary | ICD-10-CM

## 2020-01-17 DIAGNOSIS — J4550 Severe persistent asthma, uncomplicated: Secondary | ICD-10-CM

## 2020-01-17 DIAGNOSIS — J301 Allergic rhinitis due to pollen: Secondary | ICD-10-CM | POA: Diagnosis not present

## 2020-01-17 MED ORDER — THEOPHYLLINE ER 400 MG PO CP24: 400.0000 mg | ORAL_CAPSULE | Freq: Every day | ORAL | 1 refills | Status: AC

## 2020-01-17 MED ORDER — METHYLPREDNISOLONE ACETATE 40 MG/ML IJ SUSP: 40.0000 mg | Freq: Once | INTRAMUSCULAR | Status: DC

## 2020-01-17 MED ORDER — METHYLPREDNISOLONE ACETATE 80 MG/ML IJ SUSP
80.0000 mg | Freq: Once | INTRAMUSCULAR | Status: AC
  Administered 2020-01-17: 40 mg via INTRAMUSCULAR

## 2020-01-17 MED ORDER — PREDNISONE 10 MG PO TABS: ORAL_TABLET | ORAL | 0 refills | Status: DC

## 2020-01-17 NOTE — Progress Notes (Signed)
[facility] [place], [place] [address]  Pulmonary Sleep Medicine   Office Visit Note  Patient Name: Krista Robinson DOB: 23-May-1956 MRN [id number]  Date of Service: 01/17/2020  Complaints/HPI: Pt is here for sick visit.  She reports two days of wheezing.  She has been using budesonide, singulair, xyzal, and theophylline.  She reports it has not stopped for two days.  She feels short of breath.  She reports waking up at night multiple times to use nebulizer. She denies any recent illness, or coughing. She has been moving over the last week and has been a lot of dust.     ROS  General: (-) fever, (-) chills, (-) night sweats, (-) weakness Skin: (-) rashes, (-) itching,. Eyes: (-) visual changes, (-) redness, (-) itching. Nose and Sinuses: (-) nasal stuffiness or itchiness, (-) postnasal drip, (-) nosebleeds, (-) sinus trouble. Mouth and Throat: (-) sore throat, (-) hoarseness. Neck: (-) swollen glands, (-) enlarged thyroid, (-) neck pain. Respiratory: - cough, (-) bloody sputum, + shortness of breath, +wheezing. Cardiovascular: - ankle swelling, (-) chest pain. Lymphatic: (-) lymph node enlargement. Neurologic: (-) numbness, (-) tingling. Psychiatric: (-) anxiety, (-) depression   Current Medication: Outpatient Encounter Medications as of 01/17/2020  Medication Sig  . Aclidinium Bromide (TUDORZA PRESSAIR) 400 MCG/ACT AEPB Take 1 inhalation at once a day  . albuterol (VENTOLIN HFA) 108 (90 Base) MCG/ACT inhaler Inhale 2 puffs into the lungs every 6 (six) hours as needed for wheezing or shortness of breath.  . fluticasone-salmeterol (ADVAIR HFA) 115-21 MCG/ACT inhaler Inhale 2 puffs into the lungs 2 (two) times daily.  Marland Kitchen gabapentin (NEURONTIN) 100 MG capsule Take 100 mg by mouth 2 (two) times daily.  Marland Kitchen gabapentin (NEURONTIN) 600 MG tablet Take 600 mg by mouth at bedtime.  Marland Kitchen levocetirizine (XYZAL) 5 MG tablet Take 5 mg by mouth every evening.  . montelukast  (SINGULAIR) 10 MG tablet Take 1 tablet (10 mg total) by mouth at bedtime.  Marland Kitchen omalizumab Arvid Right) 150 MG/ML prefilled syringe Inject into the skin every 14 (fourteen) days. Unknown dosage  . omeprazole (PRILOSEC) 40 MG capsule Take 40 mg by mouth 2 (two) times daily.  . theophylline (THEO-24) 400 MG 24 hr capsule Take 1 capsule (400 mg total) by mouth daily.  . [DISCONTINUED] theophylline (THEO-24) 400 MG 24 hr capsule Take 1 capsule (400 mg total) by mouth daily.  . predniSONE (DELTASONE) 10 MG tablet Use per dose pack  . [EXPIRED] methylPREDNISolone acetate (DEPO-MEDROL) injection 80 mg   . [DISCONTINUED] methylPREDNISolone acetate (DEPO-MEDROL) injection 40 mg    No facility-administered encounter medications on file as of 01/17/2020.    Surgical History: Past Surgical History:  Procedure Laterality Date  . CESAREAN SECTION    . INTUBATION NASOTRACHEAL     1991 and 1995    Medical History: Past Medical History:  Diagnosis Date  . Allergy   . Anemia   . Asthma, chronic, severe persistent, uncomplicated   . Essential hypertension   . GERD (gastroesophageal reflux disease)   . Hyperlipidemia   . Obstructive chronic bronchitis without exacerbation ([place])   . Seasonal allergic rhinitis due to pollen     Family History: Family History  Problem Relation Age of Onset  . Uterine cancer Mother   . Pancreatic cancer Father     Social History: Social History   Socioeconomic History  . Marital status: Single    Spouse name: Not on file  . Number of children: Not on  file  . Years of education: Not on file  . Highest education level: Not on file  Occupational History  . Not on file  Tobacco Use  . Smoking status: Former Smoker    Types: Cigarettes  . Smokeless tobacco: Never Used  . Tobacco comment: OVER 20 YEAR- REPORTED ON 10/11/19  Substance and Sexual Activity  . Alcohol use: Yes    Alcohol/week: 1.0 standard drinks    Types: 1 Glasses of wine per week    Comment: ONCE  IN A WHILE   . Drug use: Never  . Sexual activity: Not on file  Other Topics Concern  . Not on file  Social History Narrative  . Not on file   Social Determinants of Health   Financial Resource Strain:   . Difficulty of Paying Living Expenses:   Food Insecurity:   . Worried About Charity fundraiser in the Last Year:   . Arboriculturist in the Last Year:   Transportation Needs:   . Film/video editor (Medical):   Marland Kitchen Lack of Transportation (Non-Medical):   Physical Activity:   . Days of Exercise per Week:   . Minutes of Exercise per Session:   Stress:   . Feeling of Stress :   Social Connections:   . Frequency of Communication with Friends and Family:   . Frequency of Social Gatherings with Friends and Family:   . Attends Religious Services:   . Active Member of Clubs or Organizations:   . Attends Archivist Meetings:   Marland Kitchen Marital Status:   Intimate Partner Violence:   . Fear of Current or Ex-Partner:   . Emotionally Abused:   Marland Kitchen Physically Abused:   . Sexually Abused:     Vital Signs: Blood pressure 129/78, pulse 74, temperature (!) 97.2 F (36.2 C), resp. rate 16, height 5\' 4"  (1.626 m), weight 158 lb (71.7 kg), SpO2 97 %.  Examination: General Appearance: The patient is well-developed, well-nourished, and in no distress. Skin: Gross inspection of skin unremarkable. Head: normocephalic, no gross deformities. Eyes: no gross deformities noted. ENT: ears appear grossly normal no exudates. Neck: Supple. No thyromegaly. No LAD. Respiratory: wheezing. Cardiovascular: Normal S1 and S2 without murmur or rub. Extremities: No cyanosis. pulses are equal. Neurologic: Alert and oriented. No involuntary movements.  LABS: No results found for this or any previous visit (from the past 2160 hour(s)).  Radiology: DG Chest 2 View  Result Date: 05/27/2019 CLINICAL DATA:  Shortness of breath and chest tightness for 3 months. EXAM: CHEST - 2 VIEW COMPARISON:  None.  FINDINGS: Mildly enlarged cardiac silhouette. Tortuosity of the aorta. There is no evidence of focal airspace consolidation, pleural effusion or pneumothorax. Osseous structures are without acute abnormality. Soft tissues are grossly normal. IMPRESSION: 1. Mildly enlarged cardiac silhouette. 2. Tortuosity of the aorta. 3. No focal consolidation. Electronically Signed   By: Fidela Salisbury M.D.   On: 05/27/2019 16:38    No results found.  No results found.    Assessment and Plan: Patient Active Problem List   Diagnosis Date Noted  . Asthma, chronic, severe persistent, uncomplicated   . Seasonal allergic rhinitis due to pollen   . Obstructive chronic bronchitis without exacerbation ([place])   . Essential hypertension     1. Asthma, chronic, severe persistent, uncomplicated Asthma Flare likely due to allergies.  Continue medications as prescribed.  Return to office if no improvement, or go to ED.  -Prednisone dose pack.   2. Seasonal  allergic rhinitis due to pollen Continue allergy medications and avoid exposure to pollen as much as possible.   3. Chronic obstructive pulmonary disease, unspecified COPD type (HCC) Continue Theo-24 PRN.  Give solumedrol Im dose in office and will start on prednisone burst pack tomorrow.  - theophylline (THEO-24) 400 MG 24 hr capsule; Take 1 capsule (400 mg total) by mouth daily.  Dispense: 30 capsule; Refill: 1 - methylPREDNISolone acetate (DEPO-MEDROL) injection 80 mg  4. Essential hypertension BP controlled, continue present management.   General Counseling: I have discussed the findings of the evaluation and examination with Krista Robinson.  I have also discussed any further diagnostic evaluation thatmay be needed or ordered today. Krista Robinson verbalizes understanding of the findings of todays visit. We also reviewed her medications today and discussed drug interactions and side effects including but not limited excessive drowsiness and altered mental states. We  also discussed that there is always a risk not just to her but also people around her. she has been encouraged to call the office with any questions or concerns that should arise related to todays visit.  No orders of the defined types were placed in this encounter.  Time spent: 30 This patient was seen by Orson Gear AGNP-C in Collaboration with Dr. Devona Konig as a part of collaborative care agreement.   I have personally obtained a history, examined the patient, evaluated laboratory and imaging results, formulated the assessment and plan and placed orders.    Allyne Gee, MD [facility] Pulmonary and Critical Care Sleep medicine

## 2020-02-01 ENCOUNTER — Telehealth: Payer: Self-pay

## 2020-02-01 NOTE — Telephone Encounter (Signed)
Confirmed appointment on 02/03/2020. klh

## 2020-02-03 ENCOUNTER — Encounter: Payer: Self-pay | Admitting: Internal Medicine

## 2020-02-03 ENCOUNTER — Ambulatory Visit: Payer: Medicare Other | Admitting: Internal Medicine

## 2020-02-03 ENCOUNTER — Other Ambulatory Visit: Payer: Self-pay

## 2020-02-03 VITALS — BP 126/83 | HR 68 | Temp 97.1°F | Resp 16 | Ht 64.0 in | Wt 161.0 lb

## 2020-02-03 DIAGNOSIS — J4550 Severe persistent asthma, uncomplicated: Secondary | ICD-10-CM

## 2020-02-03 DIAGNOSIS — J449 Chronic obstructive pulmonary disease, unspecified: Secondary | ICD-10-CM

## 2020-02-03 DIAGNOSIS — J301 Allergic rhinitis due to pollen: Secondary | ICD-10-CM | POA: Diagnosis not present

## 2020-02-03 DIAGNOSIS — I10 Essential (primary) hypertension: Secondary | ICD-10-CM

## 2020-02-03 MED ORDER — PREDNISONE 10 MG PO TABS: 10.0000 mg | ORAL_TABLET | Freq: Every day | ORAL | 0 refills | Status: DC

## 2020-02-03 NOTE — Progress Notes (Signed)
[facility] [place], [place] [address]  Pulmonary Sleep Medicine   Office Visit Note  Patient Name: Krista Robinson DOB: Oct 27, 1955 MRN [id number]  Date of Service: 02/03/2020  Complaints/HPI: Pt seen for pulmonary follow up.  She was seen for asthma exacerbation on 01/17/20.  She was given steroids and had some relief.  She ended up in urgent care on 01/29/20 for similar symptoms and was given steroids.  She continues to have night time Wheezing and sob.  Also in the morning, and intermittently through out the day.  She had an upper GI 2 years ago, and has been on omeprazole for a few years.      ROS  General: (-) fever, (-) chills, (-) night sweats, (-) weakness Skin: (-) rashes, (-) itching,. Eyes: (-) visual changes, (-) redness, (-) itching. Nose and Sinuses: (-) nasal stuffiness or itchiness, (-) postnasal drip, (-) nosebleeds, (-) sinus trouble. Mouth and Throat: (-) sore throat, (-) hoarseness. Neck: (-) swollen glands, (-) enlarged thyroid, (-) neck pain. Respiratory: - cough, (-) bloody sputum, - shortness of breath, - wheezing. Cardiovascular: - ankle swelling, (-) chest pain. Lymphatic: (-) lymph node enlargement. Neurologic: (-) numbness, (-) tingling. Psychiatric: (-) anxiety, (-) depression   Current Medication: Outpatient Encounter Medications as of 02/03/2020  Medication Sig  . Aclidinium Bromide (TUDORZA PRESSAIR) 400 MCG/ACT AEPB Take 1 inhalation at once a day  . albuterol (VENTOLIN HFA) 108 (90 Base) MCG/ACT inhaler Inhale 2 puffs into the lungs every 6 (six) hours as needed for wheezing or shortness of breath.  . fluticasone-salmeterol (ADVAIR HFA) 115-21 MCG/ACT inhaler Inhale 2 puffs into the lungs 2 (two) times daily.  Marland Kitchen gabapentin (NEURONTIN) 100 MG capsule Take 100 mg by mouth 2 (two) times daily.  Marland Kitchen gabapentin (NEURONTIN) 600 MG tablet Take 600 mg by mouth at bedtime.  Marland Kitchen levocetirizine (XYZAL) 5 MG tablet Take 5 mg by mouth every  evening.  . montelukast (SINGULAIR) 10 MG tablet Take 1 tablet (10 mg total) by mouth at bedtime.  Marland Kitchen omalizumab Arvid Right) 150 MG/ML prefilled syringe Inject into the skin every 14 (fourteen) days. Unknown dosage  . omeprazole (PRILOSEC) 40 MG capsule Take 40 mg by mouth 2 (two) times daily.  . predniSONE (DELTASONE) 10 MG tablet Use per dose pack  . theophylline (THEO-24) 400 MG 24 hr capsule Take 1 capsule (400 mg total) by mouth daily.   No facility-administered encounter medications on file as of 02/03/2020.    Surgical History: Past Surgical History:  Procedure Laterality Date  . CESAREAN SECTION    . INTUBATION NASOTRACHEAL     1991 and 1995    Medical History: Past Medical History:  Diagnosis Date  . Allergy   . Anemia   . Asthma, chronic, severe persistent, uncomplicated   . Essential hypertension   . GERD (gastroesophageal reflux disease)   . Hyperlipidemia   . Obstructive chronic bronchitis without exacerbation ([place])   . Seasonal allergic rhinitis due to pollen     Family History: Family History  Problem Relation Age of Onset  . Uterine cancer Mother   . Pancreatic cancer Father     Social History: Social History   Socioeconomic History  . Marital status: Single    Spouse name: Not on file  . Number of children: Not on file  . Years of education: Not on file  . Highest education level: Not on file  Occupational History  . Not on file  Tobacco Use  . Smoking  status: Former Smoker    Types: Cigarettes  . Smokeless tobacco: Never Used  . Tobacco comment: OVER 20 YEAR- REPORTED ON 10/11/19  Substance and Sexual Activity  . Alcohol use: Not Currently    Alcohol/week: 1.0 standard drinks    Types: 1 Glasses of wine per week  . Drug use: Never  . Sexual activity: Not on file  Other Topics Concern  . Not on file  Social History Narrative  . Not on file   Social Determinants of Health   Financial Resource Strain:   . Difficulty of Paying Living  Expenses:   Food Insecurity:   . Worried About Charity fundraiser in the Last Year:   . Arboriculturist in the Last Year:   Transportation Needs:   . Film/video editor (Medical):   Marland Kitchen Lack of Transportation (Non-Medical):   Physical Activity:   . Days of Exercise per Week:   . Minutes of Exercise per Session:   Stress:   . Feeling of Stress :   Social Connections:   . Frequency of Communication with Friends and Family:   . Frequency of Social Gatherings with Friends and Family:   . Attends Religious Services:   . Active Member of Clubs or Organizations:   . Attends Archivist Meetings:   Marland Kitchen Marital Status:   Intimate Partner Violence:   . Fear of Current or Ex-Partner:   . Emotionally Abused:   Marland Kitchen Physically Abused:   . Sexually Abused:     Vital Signs: Blood pressure 126/83, pulse 68, temperature (!) 97.1 F (36.2 C), resp. rate 16, height 5\' 4"  (1.626 m), weight 161 lb (73 kg), SpO2 94 %.  Examination: General Appearance: The patient is well-developed, well-nourished, and in no distress. Skin: Gross inspection of skin unremarkable. Head: normocephalic, no gross deformities. Eyes: no gross deformities noted. ENT: ears appear grossly normal no exudates. Neck: Supple. No thyromegaly. No LAD. Respiratory: clear bilaterally. Cardiovascular: Normal S1 and S2 without murmur or rub. Extremities: No cyanosis. pulses are equal. Neurologic: Alert and oriented. No involuntary movements.  LABS: No results found for this or any previous visit (from the past 2160 hour(s)).  Radiology: DG Chest 2 View  Result Date: 05/27/2019 CLINICAL DATA:  Shortness of breath and chest tightness for 3 months. EXAM: CHEST - 2 VIEW COMPARISON:  None. FINDINGS: Mildly enlarged cardiac silhouette. Tortuosity of the aorta. There is no evidence of focal airspace consolidation, pleural effusion or pneumothorax. Osseous structures are without acute abnormality. Soft tissues are grossly  normal. IMPRESSION: 1. Mildly enlarged cardiac silhouette. 2. Tortuosity of the aorta. 3. No focal consolidation. Electronically Signed   By: Fidela Salisbury M.D.   On: 05/27/2019 16:38    No results found.  No results found.    Assessment and Plan: Patient Active Problem List   Diagnosis Date Noted  . Asthma, chronic, severe persistent, uncomplicated   . Seasonal allergic rhinitis due to pollen   . Obstructive chronic bronchitis without exacerbation ([place])   . Essential hypertension     1. Asthma, chronic, severe persistent, uncomplicated Prednisone provided for PRN basis, patient will be traveling back to [place].  Sample of 200mg  trelegy given to patient also.  - predniSONE (DELTASONE) 10 MG tablet; Take 1 tablet (10 mg total) by mouth daily with breakfast.  Dispense: 60 tablet; Refill: 0  2. Seasonal allergic rhinitis due to pollen Continue montelukast as prescribed.   3. Chronic obstructive pulmonary disease, unspecified COPD type (  [place]) Continue to monitor.  4. Essential hypertension Well controlled, continue current management.   General Counseling: I have discussed the findings of the evaluation and examination with Krista Robinson.  I have also discussed any further diagnostic evaluation thatmay be needed or ordered today. Krista Robinson verbalizes understanding of the findings of todays visit. We also reviewed her medications today and discussed drug interactions and side effects including but not limited excessive drowsiness and altered mental states. We also discussed that there is always a risk not just to her but also people around her. she has been encouraged to call the office with any questions or concerns that should arise related to todays visit.  No orders of the defined types were placed in this encounter.    Time spent:  30 This patient was seen by Orson Gear AGNP-C in Collaboration with Dr. Devona Konig as a part of collaborative care agreement.   I have personally  obtained a history, examined the patient, evaluated laboratory and imaging results, formulated the assessment and plan and placed orders.    Allyne Gee, MD [facility] Pulmonary and Critical Care Sleep medicine

## 2020-02-17 ENCOUNTER — Ambulatory Visit: Payer: Medicare Other | Admitting: Internal Medicine

## 2020-02-29 ENCOUNTER — Telehealth: Payer: Self-pay

## 2020-02-29 NOTE — Telephone Encounter (Signed)
Patient rescheduled on 03/02/2020 to 03/28/2020. klh

## 2020-03-02 ENCOUNTER — Ambulatory Visit: Payer: Medicare Other | Admitting: Internal Medicine

## 2020-03-24 ENCOUNTER — Telehealth: Payer: Self-pay

## 2020-03-24 NOTE — Telephone Encounter (Signed)
Confirmed appointment on 03/28/2020 and screened for covid. klh 

## 2020-03-28 ENCOUNTER — Ambulatory Visit: Payer: Medicare Other | Admitting: Internal Medicine

## 2020-03-28 ENCOUNTER — Encounter: Payer: Self-pay | Admitting: Internal Medicine

## 2020-03-28 ENCOUNTER — Other Ambulatory Visit: Payer: Self-pay

## 2020-03-28 VITALS — BP 126/80 | HR 63 | Temp 97.0°F | Resp 16 | Ht 64.0 in | Wt 167.0 lb

## 2020-03-28 DIAGNOSIS — J4550 Severe persistent asthma, uncomplicated: Secondary | ICD-10-CM | POA: Diagnosis not present

## 2020-03-28 DIAGNOSIS — I10 Essential (primary) hypertension: Secondary | ICD-10-CM | POA: Diagnosis not present

## 2020-03-28 DIAGNOSIS — J301 Allergic rhinitis due to pollen: Secondary | ICD-10-CM

## 2020-03-28 NOTE — Patient Instructions (Signed)
Asthma, Adult  Asthma is a long-term (chronic) condition in which the airways get tight and narrow. The airways are the breathing passages that lead from the nose and mouth down into the lungs. A person with asthma will have times when symptoms get worse. These are called asthma attacks. They can cause coughing, whistling sounds when you breathe (wheezing), shortness of breath, and chest pain. They can make it hard to breathe. There is no cure for asthma, but medicines and lifestyle changes can help control it. There are many things that can bring on an asthma attack or make asthma symptoms worse (triggers). Common triggers include:  Mold.  Dust.  Cigarette smoke.  Cockroaches.  Things that can cause allergy symptoms (allergens). These include animal skin flakes (dander) and pollen from trees or grass.  Things that pollute the air. These may include household cleaners, wood smoke, smog, or chemical odors.  Cold air, weather changes, and wind.  Crying or laughing hard.  Stress.  Certain medicines or drugs.  Certain foods such as dried fruit, potato chips, and grape juice.  Infections, such as a cold or the flu.  Certain medical conditions or diseases.  Exercise or tiring activities. Asthma may be treated with medicines and by staying away from the things that cause asthma attacks. Types of medicines may include:  Controller medicines. These help prevent asthma symptoms. They are usually taken every day.  Fast-acting reliever or rescue medicines. These quickly relieve asthma symptoms. They are used as needed and provide short-term relief.  Allergy medicines if your attacks are brought on by allergens.  Medicines to help control the body's defense (immune) system. Follow these instructions at home: Avoiding triggers in your home  Change your heating and air conditioning filter often.  Limit your use of fireplaces and wood stoves.  Get rid of pests (such as roaches and  mice) and their droppings.  Throw away plants if you see mold on them.  Clean your floors. Dust regularly. Use cleaning products that do not smell.  Have someone vacuum when you are not home. Use a vacuum cleaner with a HEPA filter if possible.  Replace carpet with wood, tile, or vinyl flooring. Carpet can trap animal skin flakes and dust.  Use allergy-proof pillows, mattress covers, and box spring covers.  Wash bed sheets and blankets every week in hot water. Dry them in a dryer.  Keep your bedroom free of any triggers.  Avoid pets and keep windows closed when things that cause allergy symptoms are in the air.  Use blankets that are made of polyester or cotton.  Clean bathrooms and kitchens with bleach. If possible, have someone repaint the walls in these rooms with mold-resistant paint. Keep out of the rooms that are being cleaned and painted.  Wash your hands often with soap and water. If soap and water are not available, use hand sanitizer.  Do not allow anyone to smoke in your home. General instructions  Take over-the-counter and prescription medicines only as told by your doctor. ? Talk with your doctor if you have questions about how or when to take your medicines. ? Make note if you need to use your medicines more often than usual.  Do not use any products that contain nicotine or tobacco, such as cigarettes and e-cigarettes. If you need help quitting, ask your doctor.  Stay away from secondhand smoke.  Avoid doing things outdoors when allergen counts are high and when air quality is low.  Wear a ski mask   when doing outdoor activities in the winter. The mask should cover your nose and mouth. Exercise indoors on cold days if you can.  Warm up before you exercise. Take time to cool down after exercise.  Use a peak flow meter as told by your doctor. A peak flow meter is a tool that measures how well the lungs are working.  Keep track of the peak flow meter's readings.  Write them down.  Follow your asthma action plan. This is a written plan for taking care of your asthma and treating your attacks.  Make sure you get all the shots (vaccines) that your doctor recommends. Ask your doctor about a flu shot and a pneumonia shot.  Keep all follow-up visits as told by your doctor. This is important. Contact a doctor if:  You have wheezing, shortness of breath, or a cough even while taking medicine to prevent attacks.  The mucus you cough up (sputum) is thicker than usual.  The mucus you cough up changes from clear or white to yellow, green, gray, or bloody.  You have problems from the medicine you are taking, such as: ? A rash. ? Itching. ? Swelling. ? Trouble breathing.  You need reliever medicines more than 2-3 times a week.  Your peak flow reading is still at 50-79% of your personal best after following the action plan for 1 hour.  You have a fever. Get help right away if:  You seem to be worse and are not responding to medicine during an asthma attack.  You are short of breath even at rest.  You get short of breath when doing very little activity.  You have trouble eating, drinking, or talking.  You have chest pain or tightness.  You have a fast heartbeat.  Your lips or fingernails start to turn blue.  You are light-headed or dizzy, or you faint.  Your peak flow is less than 50% of your personal best.  You feel too tired to breathe normally. Summary  Asthma is a long-term (chronic) condition in which the airways get tight and narrow. An asthma attack can make it hard to breathe.  Asthma cannot be cured, but medicines and lifestyle changes can help control it.  Make sure you understand how to avoid triggers and how and when to use your medicines. This information is not intended to replace advice given to you by your health care provider. Make sure you discuss any questions you have with your health care provider. Document Revised:  11/05/2018 Document Reviewed: 10/07/2016 Elsevier Patient Education  2020 [employer]  

## 2020-03-28 NOTE — Progress Notes (Signed)
[facility] [place], [place] [address]  Pulmonary Sleep Medicine   Office Visit Note  Patient Name: Krista Robinson DOB: 03/25/1956 MRN [id number]  Date of Service: 03/28/2020  Complaints/HPI: Asthma.  She has had multiple issues with allergies and with asthma.  I had recommended that she get allergy testing which she did however she did not go on shots.  Patient is also being seen in [place] when she goes up there for her asthma and allergies.  She had the same allergy test done up in [place] which basically confirmed the same thing that we confirmed here I have reviewed the allergy test with her I strongly recommend that she try allergy shots.  In addition she stated that she stopped taking her Xolair her IgE level was actually greater now than it was previously in [place].  I think she may be best served seeing an allergist and immunologist to help her with her regimen at this point.  ROS  General: (-) fever, (-) chills, (-) night sweats, (-) weakness Skin: (-) rashes, (-) itching,. Eyes: (-) visual changes, (-) redness, (-) itching. Nose and Sinuses: (-) nasal stuffiness or itchiness, (-) postnasal drip, (-) nosebleeds, (-) sinus trouble. Mouth and Throat: (-) sore throat, (-) hoarseness. Neck: (-) swollen glands, (-) enlarged thyroid, (-) neck pain. Respiratory: + cough, (-) bloody sputum, + shortness of breath, + wheezing. Cardiovascular: - ankle swelling, (-) chest pain. Lymphatic: (-) lymph node enlargement. Neurologic: (-) numbness, (-) tingling. Psychiatric: (-) anxiety, (-) depression   Current Medication: Outpatient Encounter Medications as of 03/28/2020  Medication Sig  . Aclidinium Bromide (TUDORZA PRESSAIR) 400 MCG/ACT AEPB Take 1 inhalation at once a day  . albuterol (VENTOLIN HFA) 108 (90 Base) MCG/ACT inhaler Inhale 2 puffs into the lungs every 6 (six) hours as needed for wheezing or shortness of breath.  Marland Kitchen atorvastatin (LIPITOR) 20 MG  tablet Take 20 mg by mouth daily.  Marland Kitchen gabapentin (NEURONTIN) 100 MG capsule Take 100 mg by mouth 2 (two) times daily.  Marland Kitchen gabapentin (NEURONTIN) 600 MG tablet Take 600 mg by mouth at bedtime.  Marland Kitchen levocetirizine (XYZAL) 5 MG tablet Take 5 mg by mouth every evening.  . montelukast (SINGULAIR) 10 MG tablet Take 1 tablet (10 mg total) by mouth at bedtime.  Marland Kitchen omeprazole (PRILOSEC) 40 MG capsule Take 40 mg by mouth 2 (two) times daily.  . theophylline (THEO-24) 400 MG 24 hr capsule Take 1 capsule (400 mg total) by mouth daily.  . fluticasone-salmeterol (ADVAIR HFA) 115-21 MCG/ACT inhaler Inhale 2 puffs into the lungs 2 (two) times daily. (Patient not taking: Reported on 03/28/2020)  . predniSONE (DELTASONE) 10 MG tablet Take 1 tablet (10 mg total) by mouth daily with breakfast. (Patient not taking: Reported on 03/28/2020)   No facility-administered encounter medications on file as of 03/28/2020.    Surgical History: Past Surgical History:  Procedure Laterality Date  . CESAREAN SECTION    . INTUBATION NASOTRACHEAL     1991 and 1995    Medical History: Past Medical History:  Diagnosis Date  . Allergy   . Anemia   . Asthma, chronic, severe persistent, uncomplicated   . Essential hypertension   . GERD (gastroesophageal reflux disease)   . Hyperlipidemia   . Obstructive chronic bronchitis without exacerbation ([place])   . Seasonal allergic rhinitis due to pollen     Family History: Family History  Problem Relation Age of Onset  . Uterine cancer Mother   .  Pancreatic cancer Father     Social History: Social History   Socioeconomic History  . Marital status: Single    Spouse name: Not on file  . Number of children: Not on file  . Years of education: Not on file  . Highest education level: Not on file  Occupational History  . Not on file  Tobacco Use  . Smoking status: Former Smoker    Types: Cigarettes  . Smokeless tobacco: Never Used  . Tobacco comment: OVER 20 YEAR- REPORTED ON  10/11/19  Substance and Sexual Activity  . Alcohol use: Not Currently    Alcohol/week: 1.0 standard drink    Types: 1 Glasses of wine per week  . Drug use: Never  . Sexual activity: Not on file  Other Topics Concern  . Not on file  Social History Narrative  . Not on file   Social Determinants of Health   Financial Resource Strain:   . Difficulty of Paying Living Expenses:   Food Insecurity:   . Worried About Charity fundraiser in the Last Year:   . Arboriculturist in the Last Year:   Transportation Needs:   . Film/video editor (Medical):   Marland Kitchen Lack of Transportation (Non-Medical):   Physical Activity:   . Days of Exercise per Week:   . Minutes of Exercise per Session:   Stress:   . Feeling of Stress :   Social Connections:   . Frequency of Communication with Friends and Family:   . Frequency of Social Gatherings with Friends and Family:   . Attends Religious Services:   . Active Member of Clubs or Organizations:   . Attends Archivist Meetings:   Marland Kitchen Marital Status:   Intimate Partner Violence:   . Fear of Current or Ex-Partner:   . Emotionally Abused:   Marland Kitchen Physically Abused:   . Sexually Abused:     Vital Signs: Blood pressure 126/80, pulse 63, temperature (!) 97 F (36.1 C), resp. rate 16, height 5\' 4"  (1.626 m), weight 167 lb (75.8 kg), SpO2 97 %.  Examination: General Appearance: The patient is well-developed, well-nourished, and in no distress. Skin: Gross inspection of skin unremarkable. Head: normocephalic, no gross deformities. Eyes: no gross deformities noted. ENT: ears appear grossly normal no exudates. Neck: Supple. No thyromegaly. No LAD. Respiratory: Few scattered rhonchi are noted. Cardiovascular: Normal S1 and S2 without murmur or rub. Extremities: No cyanosis. pulses are equal. Neurologic: Alert and oriented. No involuntary movements.  LABS: No results found for this or any previous visit (from the past 2160  hour(s)).  Radiology: DG Chest 2 View  Result Date: 05/27/2019 CLINICAL DATA:  Shortness of breath and chest tightness for 3 months. EXAM: CHEST - 2 VIEW COMPARISON:  None. FINDINGS: Mildly enlarged cardiac silhouette. Tortuosity of the aorta. There is no evidence of focal airspace consolidation, pleural effusion or pneumothorax. Osseous structures are without acute abnormality. Soft tissues are grossly normal. IMPRESSION: 1. Mildly enlarged cardiac silhouette. 2. Tortuosity of the aorta. 3. No focal consolidation. Electronically Signed   By: Fidela Salisbury M.D.   On: 05/27/2019 16:38    No results found.  No results found.    Assessment and Plan: Patient Active Problem List   Diagnosis Date Noted  . Asthma, chronic, severe persistent, uncomplicated   . Seasonal allergic rhinitis due to pollen   . Obstructive chronic bronchitis without exacerbation ([place])   . Essential hypertension     1. Chronic Obstructive asthma she  has allergic asthma with elevated IgE positive allergy test.  I recommended that she go see an allergist we will go ahead and make a referral today.  I think she needs to go back on her anti-IgE medication as well as consider allergy shots.  I will defer this to the allergist and immunologist. 2. Allergic Rhinitis Pollen she had pretty significant changes noted on the status also I think slightly worsening of her allergy status compared to the previous testing she had done up in [place] so we will refer to the allergist. 3. HTN this should be worked up with control per her primary care physician  General Counseling: I have discussed the findings of the evaluation and examination with [name].  I have also discussed any further diagnostic evaluation thatmay be needed or ordered today. Angeliki verbalizes understanding of the findings of todays visit. We also reviewed her medications today and discussed drug interactions and side effects including but not limited excessive  drowsiness and altered mental states. We also discussed that there is always a risk not just to her but also people around her. she has been encouraged to call the office with any questions or concerns that should arise related to todays visit.  Orders Placed This Encounter  Procedures  . Ambulatory referral to Allergy    Referral Priority:   Routine    Referral Type:   Allergy Testing    Referral Reason:   Specialty Services Required    Requested Specialty:   Allergy    Number of Visits Requested:   1     Time spent: 35 minutes  I have personally obtained a history, examined the patient, evaluated laboratory and imaging results, formulated the assessment and plan and placed orders.    Allyne Gee, MD [facility] Pulmonary and Critical Care Sleep medicine

## 2020-05-14 ENCOUNTER — Other Ambulatory Visit: Payer: Self-pay | Admitting: Internal Medicine

## 2020-09-01 ENCOUNTER — Ambulatory Visit: Payer: Medicare Other | Admitting: Podiatry

## 2020-09-01 ENCOUNTER — Encounter: Payer: Self-pay | Admitting: Podiatry

## 2020-09-01 ENCOUNTER — Other Ambulatory Visit: Payer: Self-pay

## 2020-09-01 ENCOUNTER — Ambulatory Visit (INDEPENDENT_AMBULATORY_CARE_PROVIDER_SITE_OTHER): Payer: Medicare Other

## 2020-09-01 DIAGNOSIS — M779 Enthesopathy, unspecified: Secondary | ICD-10-CM

## 2020-09-01 DIAGNOSIS — M778 Other enthesopathies, not elsewhere classified: Secondary | ICD-10-CM | POA: Diagnosis not present

## 2020-09-01 MED ORDER — BETAMETHASONE SOD PHOS & ACET 6 (3-3) MG/ML IJ SUSP: 3.0000 mg | Freq: Once | INTRAMUSCULAR | Status: DC

## 2020-09-01 MED ORDER — MELOXICAM 15 MG PO TABS: 15.0000 mg | ORAL_TABLET | Freq: Every day | ORAL | 1 refills | Status: DC
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
## 2020-09-25 ENCOUNTER — Ambulatory Visit: Payer: Medicare Other | Admitting: Hospice and Palliative Medicine

## 2020-09-25 ENCOUNTER — Encounter: Payer: Self-pay | Admitting: Internal Medicine

## 2020-09-25 ENCOUNTER — Other Ambulatory Visit: Payer: Self-pay

## 2020-09-25 VITALS — BP 116/62 | HR 70 | Temp 98.0°F | Resp 16 | Ht 64.0 in | Wt 172.0 lb

## 2020-09-25 DIAGNOSIS — J4550 Severe persistent asthma, uncomplicated: Secondary | ICD-10-CM | POA: Diagnosis not present

## 2020-09-25 DIAGNOSIS — J301 Allergic rhinitis due to pollen: Secondary | ICD-10-CM

## 2020-09-25 DIAGNOSIS — R0602 Shortness of breath: Secondary | ICD-10-CM | POA: Diagnosis not present

## 2020-09-25 MED ORDER — TUDORZA PRESSAIR 400 MCG/ACT IN AEPB: INHALATION_SPRAY | RESPIRATORY_TRACT | 3 refills | Status: DC

## 2020-09-25 NOTE — Progress Notes (Deleted)
[facility] [place], [place] [address]  Pulmonary Sleep Medicine   Office Visit Note  Patient Name: Krista Robinson DOB: 06/05/1956 MRN [id number]  Date of Service: 09/25/2020  Complaints/HPI: ***  ROS  General: (-) fever, (-) chills, (-) night sweats, (-) weakness Skin: (-) rashes, (-) itching,. Eyes: (-) visual changes, (-) redness, (-) itching. Nose and Sinuses: (-) nasal stuffiness or itchiness, (-) postnasal drip, (-) nosebleeds, (-) sinus trouble. Mouth and Throat: (-) sore throat, (-) hoarseness. Neck: (-) swollen glands, (-) enlarged thyroid, (-) neck pain. Respiratory: *** cough, (-) bloody sputum, *** shortness of breath, *** wheezing. Cardiovascular: *** ankle swelling, (-) chest pain. Lymphatic: (-) lymph node enlargement. Neurologic: (-) numbness, (-) tingling. Psychiatric: (-) anxiety, (-) depression   Current Medication: Outpatient Encounter Medications as of 09/25/2020  Medication Sig  . Aclidinium Bromide (TUDORZA PRESSAIR) 400 MCG/ACT AEPB Take 1 inhalation at once a day  . albuterol (VENTOLIN HFA) 108 (90 Base) MCG/ACT inhaler TAKE 2 PUFFS BY MOUTH EVERY 6 HOURS AS NEEDED FOR WHEEZE OR SHORTNESS OF BREATH  . atorvastatin (LIPITOR) 20 MG tablet Take 20 mg by mouth daily.  Marland Kitchen gabapentin (NEURONTIN) 600 MG tablet Take 600 mg by mouth at bedtime.  Marland Kitchen levocetirizine (XYZAL) 5 MG tablet Take 5 mg by mouth every evening.  . meloxicam (MOBIC) 15 MG tablet Take 1 tablet (15 mg total) by mouth daily.  . methylPREDNISolone (MEDROL DOSEPAK) 4 MG TBPK tablet Follow schedule on package instructions  . montelukast (SINGULAIR) 10 MG tablet Take 1 tablet (10 mg total) by mouth at bedtime.  Marland Kitchen omeprazole (PRILOSEC) 40 MG capsule Take 40 mg by mouth 2 (two) times daily.  . predniSONE (DELTASONE) 10 MG tablet Take 1 tablet (10 mg total) by mouth daily with breakfast.  . theophylline (THEO-24) 400 MG 24 hr capsule Take 1 capsule (400 mg total) by mouth  daily.  . fluticasone-salmeterol (ADVAIR HFA) 115-21 MCG/ACT inhaler Inhale 2 puffs into the lungs 2 (two) times daily. (Patient not taking: No sig reported)   Facility-Administered Encounter Medications as of 09/25/2020  Medication  . betamethasone acetate-betamethasone sodium phosphate (CELESTONE) injection 3 mg    Surgical History: Past Surgical History:  Procedure Laterality Date  . CESAREAN SECTION    . INTUBATION NASOTRACHEAL     1991 and 1995    Medical History: Past Medical History:  Diagnosis Date  . Allergy   . Anemia   . Asthma, chronic, severe persistent, uncomplicated   . Essential hypertension   . GERD (gastroesophageal reflux disease)   . Hyperlipidemia   . Obstructive chronic bronchitis without exacerbation ([place])   . Seasonal allergic rhinitis due to pollen     Family History: Family History  Problem Relation Age of Onset  . Uterine cancer Mother   . Pancreatic cancer Father     Social History: Social History   Socioeconomic History  . Marital status: Single    Spouse name: Not on file  . Number of children: Not on file  . Years of education: Not on file  . Highest education level: Not on file  Occupational History  . Not on file  Tobacco Use  . Smoking status: Former Smoker    Types: Cigarettes  . Smokeless tobacco: Never Used  . Tobacco comment: OVER 20 YEAR- REPORTED ON 10/11/19  Substance and Sexual Activity  . Alcohol use: Not Currently    Alcohol/week: 1.0 standard drink    Types: 1 Glasses of wine per week  .  Drug use: Never  . Sexual activity: Not on file  Other Topics Concern  . Not on file  Social History Narrative  . Not on file   Social Determinants of Health   Financial Resource Strain: Not on file  Food Insecurity: Not on file  Transportation Needs: Not on file  Physical Activity: Not on file  Stress: Not on file  Social Connections: Not on file  Intimate Partner Violence: Not on file    Vital Signs: Resp. rate 16,  height 5\' 4"  (1.626 m).  Examination: General Appearance: The patient is well-developed, well-nourished, and in no distress. Skin: Gross inspection of skin unremarkable. Head: normocephalic, no gross deformities. Eyes: no gross deformities noted. ENT: ears appear grossly normal no exudates. Neck: Supple. No thyromegaly. No LAD. Respiratory: ***. Cardiovascular: Normal S1 and S2 without murmur or rub. Extremities: No cyanosis. pulses are equal. Neurologic: Alert and oriented. No involuntary movements.  LABS: No results found for this or any previous visit (from the past 2160 hour(s)).  Radiology: DG Chest 2 View  Result Date: 05/27/2019 CLINICAL DATA:  Shortness of breath and chest tightness for 3 months. EXAM: CHEST - 2 VIEW COMPARISON:  None. FINDINGS: Mildly enlarged cardiac silhouette. Tortuosity of the aorta. There is no evidence of focal airspace consolidation, pleural effusion or pneumothorax. Osseous structures are without acute abnormality. Soft tissues are grossly normal. IMPRESSION: 1. Mildly enlarged cardiac silhouette. 2. Tortuosity of the aorta. 3. No focal consolidation. Electronically Signed   By: Fidela Salisbury M.D.   On: 05/27/2019 16:38    No results found.  DG Foot Complete Left  Result Date: 09/01/2020 Please see detailed radiograph report in office note.  DG Foot Complete Right  Result Date: 09/01/2020 Please see detailed radiograph report in office note.     Assessment and Plan: Patient Active Problem List   Diagnosis Date Noted  . Asthma, chronic, severe persistent, uncomplicated   . Seasonal allergic rhinitis due to pollen   . Obstructive chronic bronchitis without exacerbation ([place])   . Essential hypertension     1. ***  General Counseling: I have discussed the findings of the evaluation and examination with Krista Robinson.  I have also discussed any further diagnostic evaluation thatmay be needed or ordered today. Krista Robinson verbalizes understanding  of the findings of todays visit. We also reviewed her medications today and discussed drug interactions and side effects including but not limited excessive drowsiness and altered mental states. We also discussed that there is always a risk not just to her but also people around her. she has been encouraged to call the office with any questions or concerns that should arise related to todays visit.  Orders Placed This Encounter  Procedures  . Spirometry with Graph    Order Specific Question:   Where should this test be performed?    Answer:   [facility]     Time spent: ***  I have personally obtained a history, examined the patient, evaluated laboratory and imaging results, formulated the assessment and plan and placed orders. This patient was seen by Casey Burkitt AGNP-C in Collaboration with Dr. Devona Konig as a part of collaborative care agreement.    Allyne Gee, MD [facility] Pulmonary and Critical Care Sleep medicine

## 2020-09-25 NOTE — Progress Notes (Signed)
[facility] [place], [place] [address]  Pulmonary Sleep Medicine   Office Visit Note  Patient Name: Krista Robinson DOB: 05-Dec-1955 MRN [id number]  Date of Service: 09/26/2020  Complaints/HPI: Patient is here for routine pulmonary follow-up Was recently seen by allergy specialist, Mr. Whalen--there was some confusion regarding plan of care, Dr. Devona Konig in room during visit, contacted Dr. Remus Blake Dr. Remus Blake and her office are on board with administering Xolair, previously prescribed by allergy specialist in [place] She spends part of her time here local and part of her time in [place] has been approved, pharmacy is waiting to be told where to send medications Again, Dr. Humphrey Rolls discussed allergy injections, due to the severity of her allergies and elevated IgE levels she will likely require at minimum once weekly, possible twice weekly This will be a commitment she has to be willing to make, will need to plan her travelling accordingly  ROS  General: (-) fever, (-) chills, (-) night sweats, (-) weakness Skin: (-) rashes, (-) itching,. Eyes: (-) visual changes, (-) redness, (-) itching. Nose and Sinuses: (-) nasal stuffiness or itchiness, (-) postnasal drip, (-) nosebleeds, (-) sinus trouble. Mouth and Throat: (-) sore throat, (-) hoarseness. Neck: (-) swollen glands, (-) enlarged thyroid, (-) neck pain. Respiratory: + cough, (-) bloody sputum, + shortness of breath, + wheezing. Cardiovascular: - ankle swelling, (-) chest pain. Lymphatic: (-) lymph node enlargement. Neurologic: (-) numbness, (-) tingling. Psychiatric: (-) anxiety, (-) depression   Current Medication: Outpatient Encounter Medications as of 09/25/2020  Medication Sig  . albuterol (VENTOLIN HFA) 108 (90 Base) MCG/ACT inhaler TAKE 2 PUFFS BY MOUTH EVERY 6 HOURS AS NEEDED FOR WHEEZE OR SHORTNESS OF BREATH  . atorvastatin (LIPITOR) 20 MG tablet Take 20 mg by mouth daily.  Marland Kitchen gabapentin  (NEURONTIN) 600 MG tablet Take 600 mg by mouth at bedtime.  Marland Kitchen levocetirizine (XYZAL) 5 MG tablet Take 5 mg by mouth every evening.  . methylPREDNISolone (MEDROL DOSEPAK) 4 MG TBPK tablet Follow schedule on package instructions  . montelukast (SINGULAIR) 10 MG tablet Take 1 tablet (10 mg total) by mouth at bedtime.  Marland Kitchen omeprazole (PRILOSEC) 40 MG capsule Take 40 mg by mouth 2 (two) times daily.  . predniSONE (DELTASONE) 10 MG tablet Take 1 tablet (10 mg total) by mouth daily with breakfast.  . theophylline (THEO-24) 400 MG 24 hr capsule Take 1 capsule (400 mg total) by mouth daily.  . [DISCONTINUED] Aclidinium Bromide (TUDORZA PRESSAIR) 400 MCG/ACT AEPB Take 1 inhalation at once a day  . Aclidinium Bromide (TUDORZA PRESSAIR) 400 MCG/ACT AEPB Take 1 inhalation at once a day  . fluticasone-salmeterol (ADVAIR HFA) 115-21 MCG/ACT inhaler Inhale 2 puffs into the lungs 2 (two) times daily. (Patient not taking: No sig reported)  . [DISCONTINUED] meloxicam (MOBIC) 15 MG tablet Take 1 tablet (15 mg total) by mouth daily. (Patient not taking: Reported on 09/25/2020)   Facility-Administered Encounter Medications as of 09/25/2020  Medication  . betamethasone acetate-betamethasone sodium phosphate (CELESTONE) injection 3 mg    Surgical History: Past Surgical History:  Procedure Laterality Date  . CESAREAN SECTION    . INTUBATION NASOTRACHEAL     1991 and 1995    Medical History: Past Medical History:  Diagnosis Date  . Allergy   . Anemia   . Asthma, chronic, severe persistent, uncomplicated   . Essential hypertension   . GERD (gastroesophageal reflux disease)   . Hyperlipidemia   . Obstructive chronic bronchitis without exacerbation (  [place])   . Seasonal allergic rhinitis due to pollen     Family History: Family History  Problem Relation Age of Onset  . Uterine cancer Mother   . Pancreatic cancer Father     Social History: Social History   Socioeconomic History  . Marital status: Single     Spouse name: Not on file  . Number of children: Not on file  . Years of education: Not on file  . Highest education level: Not on file  Occupational History  . Not on file  Tobacco Use  . Smoking status: Former Smoker    Types: Cigarettes  . Smokeless tobacco: Never Used  . Tobacco comment: OVER 20 YEAR- REPORTED ON 10/11/19  Substance and Sexual Activity  . Alcohol use: Not Currently    Alcohol/week: 1.0 standard drink    Types: 1 Glasses of wine per week  . Drug use: Never  . Sexual activity: Not on file  Other Topics Concern  . Not on file  Social History Narrative  . Not on file   Social Determinants of Health   Financial Resource Strain: Not on file  Food Insecurity: Not on file  Transportation Needs: Not on file  Physical Activity: Not on file  Stress: Not on file  Social Connections: Not on file  Intimate Partner Violence: Not on file    Vital Signs: Blood pressure 116/62, pulse 70, temperature 98 F (36.7 C), resp. rate 16, height 5\' 4"  (1.626 m), weight 172 lb (78 kg), SpO2 98 %.  Examination: General Appearance: The patient is well-developed, well-nourished, and in no distress. Skin: Gross inspection of skin unremarkable. Head: normocephalic, no gross deformities. Eyes: no gross deformities noted. ENT: ears appear grossly normal no exudates. Neck: Supple. No thyromegaly. No LAD. Respiratory: Clear throughout, no rhonchi, wheeze or rales noted. Cardiovascular: Normal S1 and S2 without murmur or rub. Extremities: No cyanosis. pulses are equal. Neurologic: Alert and oriented. No involuntary movements.  LABS: No results found for this or any previous visit (from the past 2160 hour(s)).  Radiology: DG Chest 2 View  Result Date: 05/27/2019 CLINICAL DATA:  Shortness of breath and chest tightness for 3 months. EXAM: CHEST - 2 VIEW COMPARISON:  None. FINDINGS: Mildly enlarged cardiac silhouette. Tortuosity of the aorta. There is no evidence of focal airspace  consolidation, pleural effusion or pneumothorax. Osseous structures are without acute abnormality. Soft tissues are grossly normal. IMPRESSION: 1. Mildly enlarged cardiac silhouette. 2. Tortuosity of the aorta. 3. No focal consolidation. Electronically Signed   By: Fidela Salisbury M.D.   On: 05/27/2019 16:38    No results found.  DG Foot Complete Left  Result Date: 09/01/2020 Please see detailed radiograph report in office note.  DG Foot Complete Right  Result Date: 09/01/2020 Please see detailed radiograph report in office note.     Assessment and Plan: Patient Active Problem List   Diagnosis Date Noted  . Asthma, chronic, severe persistent, uncomplicated   . Seasonal allergic rhinitis due to pollen   . Obstructive chronic bronchitis without exacerbation ([place])   . Essential hypertension     1. Chronic persistent asthma-Plan will be to have pharmacy send Xolair to Dr. Remus Blake for their office to administer. With severe allergies and elevated IgE being managed by allergist is most appropriate. 2. Allergic Rhinitis-Discussed allergy injections and the commitment that comes along with starting therapy. She will need to consider how she would like to proceed. She will not be able to start allergy injections in  our office and continue with them in [place]. 3. Shortness of breath-stable spirometry, FEV1 1.3L, 62%  General Counseling: I have discussed the findings of the evaluation and examination with [name].  I have also discussed any further diagnostic evaluation thatmay be needed or ordered today. Lorrie verbalizes understanding of the findings of todays visit. We also reviewed her medications today and discussed drug interactions and side effects including but not limited excessive drowsiness and altered mental states. We also discussed that there is always a risk not just to her but also people around her. she has been encouraged to call the office with any questions or concerns  that should arise related to todays visit.  Orders Placed This Encounter  Procedures  . Spirometry with Graph    Order Specific Question:   Where should this test be performed?    Answer:   [facility]     Time spent: 37  I have personally obtained a history, examined the patient, evaluated laboratory and imaging results, formulated the assessment and plan and placed orders. This patient was seen by Casey Burkitt AGNP-C in Collaboration with Dr. Devona Konig as a part of collaborative care agreement.    Allyne Gee, MD [facility] Pulmonary and Critical Care Sleep medicine

## 2020-09-26 ENCOUNTER — Encounter: Payer: Self-pay | Admitting: Hospice and Palliative Medicine

## 2020-09-29 ENCOUNTER — Encounter: Payer: Self-pay | Admitting: Podiatry

## 2020-09-29 ENCOUNTER — Other Ambulatory Visit: Payer: Self-pay

## 2020-09-29 ENCOUNTER — Ambulatory Visit: Payer: Medicare Other | Admitting: Podiatry

## 2020-09-29 DIAGNOSIS — M778 Other enthesopathies, not elsewhere classified: Secondary | ICD-10-CM

## 2020-09-29 NOTE — Progress Notes (Signed)
   HPI: 65 y.o. female presenting today for follow-up evaluation of midtarsal capsulitis to the bilateral feet.  Patient states she is doing much better.  She has been ambulating and walking with no pain.  Patient states that the injections helped significantly and she has been taking the meloxicam as needed.  No new complaints at this time  Past Medical History:  Diagnosis Date  . Allergy   . Anemia   . Asthma, chronic, severe persistent, uncomplicated   . Essential hypertension   . GERD (gastroesophageal reflux disease)   . Hyperlipidemia   . Obstructive chronic bronchitis without exacerbation ([place])   . Seasonal allergic rhinitis due to pollen      Physical Exam: General: The patient is alert and oriented x3 in no acute distress.  Dermatology: Skin is warm, dry and supple bilateral lower extremities. Negative for open lesions or macerations.  Vascular: Palpable pedal pulses bilaterally. No edema or erythema noted. Capillary refill within normal limits.  Neurological: Epicritic and protective threshold grossly intact bilaterally.   Musculoskeletal Exam: Range of motion within normal limits to all pedal and ankle joints bilateral. Muscle strength 5/5 in all groups bilateral.  Negative for any significant pain on palpation throughout the midtarsal joints bilateral feet  Assessment: 1.  Midtarsal capsulitis bilateral; resolved   Plan of Care:  1. Patient evaluated. 2.  Recommend wearing good supportive shoes daily 3.  Continue meloxicam as needed 4.  Return to clinic as needed      Edrick Kins, DPM [facility]  Dr. Edrick Kins, DPM    [address], [place] [address]                Office [phone]  Fax [phone]

## 2021-03-13 ENCOUNTER — Telehealth: Payer: Self-pay

## 2021-03-13 NOTE — Telephone Encounter (Signed)
Spoke to pt about the Tudorza inhaler on a PA, Pt was denied on the PA.  Per pt she has had multiple problems with her insurance and things being coded wrong.  Per pt she will wait til she comes in for her appt 04/09/21 and discuss with DSK.  At the moment pt advised that we don't need to worry about the PA on the Tudorza inhaler.

## 2021-03-26 ENCOUNTER — Ambulatory Visit: Payer: Medicare Other | Admitting: Internal Medicine

## 2021-04-09 ENCOUNTER — Encounter: Payer: Self-pay | Admitting: Internal Medicine

## 2021-04-09 ENCOUNTER — Other Ambulatory Visit: Payer: Self-pay

## 2021-04-09 ENCOUNTER — Ambulatory Visit: Payer: Medicare Other | Admitting: Internal Medicine

## 2021-04-09 VITALS — BP 116/74 | HR 80 | Temp 97.3°F | Resp 16 | Ht 63.5 in | Wt 163.0 lb

## 2021-04-09 DIAGNOSIS — J301 Allergic rhinitis due to pollen: Secondary | ICD-10-CM | POA: Diagnosis not present

## 2021-04-09 DIAGNOSIS — J4550 Severe persistent asthma, uncomplicated: Secondary | ICD-10-CM

## 2021-04-09 NOTE — Progress Notes (Signed)
[facility] [place], [place] [address]  Pulmonary Sleep Medicine   Office Visit Note  Patient Name: Krista Robinson DOB: May 04, 1956 MRN [id number]  Date of Service: 04/09/2021  Complaints/HPI: Asthma, COPD, Allergies. She states that she has stopped taking the xolair since April. Apparently had already been cut down to once per month so decided to stop it all together. The insurance had apparently told her that it was not necessary for her to be taking it as she was taking it.  She therefore decided to completely stop.  In after she has stopped she really has not noticed any major difference.  Asthma has been under fairly good control.  She has not required any admissions to the hospital.  ROS  General: (-) fever, (-) chills, (-) night sweats, (-) weakness Skin: (-) rashes, (-) itching,. Eyes: (-) visual changes, (-) redness, (-) itching. Nose and Sinuses: (-) nasal stuffiness or itchiness, (-) postnasal drip, (-) nosebleeds, (-) sinus trouble. Mouth and Throat: (-) sore throat, (-) hoarseness. Neck: (-) swollen glands, (-) enlarged thyroid, (-) neck pain. Respiratory: - cough, (-) bloody sputum, - shortness of breath, - wheezing. Cardiovascular: - ankle swelling, (-) chest pain. Lymphatic: (-) lymph node enlargement. Neurologic: (-) numbness, (-) tingling. Psychiatric: (-) anxiety, (-) depression   Current Medication: Outpatient Encounter Medications as of 04/09/2021  Medication Sig   Aclidinium Bromide (TUDORZA PRESSAIR) 400 MCG/ACT AEPB Take 1 inhalation at once a day   albuterol (VENTOLIN HFA) 108 (90 Base) MCG/ACT inhaler TAKE 2 PUFFS BY MOUTH EVERY 6 HOURS AS NEEDED FOR WHEEZE OR SHORTNESS OF BREATH   atorvastatin (LIPITOR) 20 MG tablet Take 20 mg by mouth daily.   levocetirizine (XYZAL) 5 MG tablet Take 5 mg by mouth every evening.   methylPREDNISolone (MEDROL DOSEPAK) 4 MG TBPK tablet Follow schedule on package instructions   montelukast (SINGULAIR)  10 MG tablet Take 1 tablet (10 mg total) by mouth at bedtime.   omeprazole (PRILOSEC) 40 MG capsule Take 40 mg by mouth 2 (two) times daily.   theophylline (THEO-24) 400 MG 24 hr capsule Take 1 capsule (400 mg total) by mouth daily.   [DISCONTINUED] fluticasone-salmeterol (ADVAIR HFA) 115-21 MCG/ACT inhaler Inhale 2 puffs into the lungs 2 (two) times daily. (Patient not taking: No sig reported)   [DISCONTINUED] gabapentin (NEURONTIN) 600 MG tablet Take 600 mg by mouth at bedtime. (Patient not taking: Reported on 04/09/2021)   [DISCONTINUED] predniSONE (DELTASONE) 10 MG tablet Take 1 tablet (10 mg total) by mouth daily with breakfast. (Patient not taking: Reported on 04/09/2021)   Facility-Administered Encounter Medications as of 04/09/2021  Medication   betamethasone acetate-betamethasone sodium phosphate (CELESTONE) injection 3 mg    Surgical History: Past Surgical History:  Procedure Laterality Date   CESAREAN SECTION     INTUBATION NASOTRACHEAL     1991 and 1995    Medical History: Past Medical History:  Diagnosis Date   Allergy    Anemia    Asthma, chronic, severe persistent, uncomplicated    Essential hypertension    GERD (gastroesophageal reflux disease)    Hyperlipidemia    Obstructive chronic bronchitis without exacerbation ([facility])    Seasonal allergic rhinitis due to pollen     Family History: Family History  Problem Relation Age of Onset   Uterine cancer Mother    Pancreatic cancer Father     Social History: Social History   Socioeconomic History   Marital status: Single    Spouse name: Not on file  Number of children: Not on file   Years of education: Not on file   Highest education level: Not on file  Occupational History   Not on file  Tobacco Use   Smoking status: Former    Types: Cigarettes   Smokeless tobacco: Never   Tobacco comments:    OVER 20 YEAR- REPORTED ON 10/11/19  Substance and Sexual Activity   Alcohol use: Not Currently     Alcohol/week: 1.0 standard drink    Types: 1 Glasses of wine per week   Drug use: Never   Sexual activity: Not on file  Other Topics Concern   Not on file  Social History Narrative   Not on file   Social Determinants of Health   Financial Resource Strain: Not on file  Food Insecurity: Not on file  Transportation Needs: Not on file  Physical Activity: Not on file  Stress: Not on file  Social Connections: Not on file  Intimate Partner Violence: Not on file    Vital Signs: Blood pressure 116/74, pulse 80, temperature (!) 97.3 F (36.3 C), resp. rate 16, height 5' 3.5" (1.613 m), weight 163 lb (73.9 kg), SpO2 97 %.  Examination: General Appearance: The patient is well-developed, well-nourished, and in no distress. Skin: Gross inspection of skin unremarkable. Head: normocephalic, no gross deformities. Eyes: no gross deformities noted. ENT: ears appear grossly normal no exudates. Neck: Supple. No thyromegaly. No LAD. Respiratory: no rhonchi noted. Cardiovascular: Normal S1 and S2 without murmur or rub. Extremities: No cyanosis. pulses are equal. Neurologic: Alert and oriented. No involuntary movements.  LABS: No results found for this or any previous visit (from the past 2160 hour(s)).  Radiology: DG Chest 2 View  Result Date: 05/27/2019 CLINICAL DATA:  Shortness of breath and chest tightness for 3 months. EXAM: CHEST - 2 VIEW COMPARISON:  None. FINDINGS: Mildly enlarged cardiac silhouette. Tortuosity of the aorta. There is no evidence of focal airspace consolidation, pleural effusion or pneumothorax. Osseous structures are without acute abnormality. Soft tissues are grossly normal. IMPRESSION: 1. Mildly enlarged cardiac silhouette. 2. Tortuosity of the aorta. 3. No focal consolidation. Electronically Signed   By: Fidela Salisbury M.D.   On: 05/27/2019 16:38    No results found.  No results found.    Assessment and Plan: Patient Active Problem List   Diagnosis Date  Noted   Asthma, chronic, severe persistent, uncomplicated    Seasonal allergic rhinitis due to pollen    Obstructive chronic bronchitis without exacerbation ([place])    Essential hypertension     1. Seasonal allergic rhinitis due to pollen Antihistamines as needed.  Patient is also on Singulair which should be continued.  2. Asthma, chronic, severe persistent, uncomplicated Continue with her antihistamines continue with Singulair.  Inhalers as necessary.  Also patient has been on theophylline.  Which will be continued.  She has not had any side effects from the theophylline  General Counseling: I have discussed the findings of the evaluation and examination with [name].  I have also discussed any further diagnostic evaluation thatmay be needed or ordered today. Laiba verbalizes understanding of the findings of todays visit. We also reviewed her medications today and discussed drug interactions and side effects including but not limited excessive drowsiness and altered mental states. We also discussed that there is always a risk not just to her but also people around her. she has been encouraged to call the office with any questions or concerns that should arise related to todays visit.  No  orders of the defined types were placed in this encounter.    Time spent: 69  I have personally obtained a history, examined the patient, evaluated laboratory and imaging results, formulated the assessment and plan and placed orders.    Allyne Gee, MD [facility] Pulmonary and Critical Care Sleep medicine

## 2021-04-09 NOTE — Patient Instructions (Signed)
Asthma, Adult  Asthma is a long-term (chronic) condition in which the airways get tight and narrow. The airways are the breathing passages that lead from the nose and mouth down into the lungs. A person with asthma will have times when symptoms get worse. These are called asthma attacks. They can cause coughing, whistling sounds when you breathe (wheezing), shortness of breath, and chest pain. They can make it hard to breathe. Thereis no cure for asthma, but medicines and lifestyle changes can help control it. There are many things that can bring on an asthma attack or make asthma symptoms worse (triggers). Common triggers include: Mold. Dust. Cigarette smoke. Cockroaches. Things that can cause allergy symptoms (allergens). These include animal skin flakes (dander) and pollen from trees or grass. Things that pollute the air. These may include household cleaners, wood smoke, smog, or chemical odors. Cold air, weather changes, and wind. Crying or laughing hard. Stress. Certain medicines or drugs. Certain foods such as dried fruit, potato chips, and grape juice. Infections, such as a cold or the flu. Certain medical conditions or diseases. Exercise or tiring activities. Asthma may be treated with medicines and by staying away from the things that cause asthma attacks. Types of medicines may include: Controller medicines. These help prevent asthma symptoms. They are usually taken every day. Fast-acting reliever or rescue medicines. These quickly relieve asthma symptoms. They are used as needed and provide short-term relief. Allergy medicines if your attacks are brought on by allergens. Medicines to help control the body's defense (immune) system. Follow these instructions at home: Avoiding triggers in your home Change your heating and air conditioning filter often. Limit your use of fireplaces and wood stoves. Get rid of pests (such as roaches and mice) and their droppings. Throw away plants  if you see mold on them. Clean your floors. Dust regularly. Use cleaning products that do not smell. Have someone vacuum when you are not home. Use a vacuum cleaner with a HEPA filter if possible. Replace carpet with wood, tile, or vinyl flooring. Carpet can trap animal skin flakes and dust. Use allergy-proof pillows, mattress covers, and box spring covers. Wash bed sheets and blankets every week in hot water. Dry them in a dryer. Keep your bedroom free of any triggers. Avoid pets and keep windows closed when things that cause allergy symptoms are in the air. Use blankets that are made of polyester or cotton. Clean bathrooms and kitchens with bleach. If possible, have someone repaint the walls in these rooms with mold-resistant paint. Keep out of the rooms that are being cleaned and painted. Wash your hands often with soap and water. If soap and water are not available, use hand sanitizer. Do not allow anyone to smoke in your home. General instructions Take over-the-counter and prescription medicines only as told by your doctor. Talk with your doctor if you have questions about how or when to take your medicines. Make note if you need to use your medicines more often than usual. Do not use any products that contain nicotine or tobacco, such as cigarettes and e-cigarettes. If you need help quitting, ask your doctor. Stay away from secondhand smoke. Avoid doing things outdoors when allergen counts are high and when air quality is low. Wear a ski mask when doing outdoor activities in the winter. The mask should cover your nose and mouth. Exercise indoors on cold days if you can. Warm up before you exercise. Take time to cool down after exercise. Use a peak flow meter as  told by your doctor. A peak flow meter is a tool that measures how well the lungs are working. Keep track of the peak flow meter's readings. Write them down. Follow your asthma action plan. This is a written plan for taking care  of your asthma and treating your attacks. Make sure you get all the shots (vaccines) that your doctor recommends. Ask your doctor about a flu shot and a pneumonia shot. Keep all follow-up visits as told by your doctor. This is important. Contact a doctor if: You have wheezing, shortness of breath, or a cough even while taking medicine to prevent attacks. The mucus you cough up (sputum) is thicker than usual. The mucus you cough up changes from clear or white to yellow, green, gray, or bloody. You have problems from the medicine you are taking, such as: A rash. Itching. Swelling. Trouble breathing. You need reliever medicines more than 2-3 times a week. Your peak flow reading is still at 50-79% of your personal best after following the action plan for 1 hour. You have a fever. Get help right away if: You seem to be worse and are not responding to medicine during an asthma attack. You are short of breath even at rest. You get short of breath when doing very little activity. You have trouble eating, drinking, or talking. You have chest pain or tightness. You have a fast heartbeat. Your lips or fingernails start to turn blue. You are light-headed or dizzy, or you faint. Your peak flow is less than 50% of your personal best. You feel too tired to breathe normally. Summary Asthma is a long-term (chronic) condition in which the airways get tight and narrow. An asthma attack can make it hard to breathe. Asthma cannot be cured, but medicines and lifestyle changes can help control it. Make sure you understand how to avoid triggers and how and when to use your medicines. This information is not intended to replace advice given to you by your health care provider. Make sure you discuss any questions you have with your healthcare provider. Document Revised: 01/05/2020 Document Reviewed: 01/05/2020 Elsevier Patient Education  2022 [employer].

## 2021-06-05 ENCOUNTER — Encounter: Payer: Self-pay | Admitting: Podiatry

## 2021-06-05 ENCOUNTER — Other Ambulatory Visit: Payer: Self-pay

## 2021-06-05 ENCOUNTER — Ambulatory Visit: Payer: Medicare Other | Admitting: Podiatry

## 2021-06-05 DIAGNOSIS — M7662 Achilles tendinitis, left leg: Secondary | ICD-10-CM | POA: Diagnosis not present

## 2021-06-05 DIAGNOSIS — M76822 Posterior tibial tendinitis, left leg: Secondary | ICD-10-CM

## 2021-06-05 DIAGNOSIS — M722 Plantar fascial fibromatosis: Secondary | ICD-10-CM

## 2021-06-05 DIAGNOSIS — L209 Atopic dermatitis, unspecified: Secondary | ICD-10-CM | POA: Insufficient documentation

## 2021-06-05 DIAGNOSIS — Z91010 Allergy to peanuts: Secondary | ICD-10-CM | POA: Insufficient documentation

## 2021-06-05 MED ORDER — METHYLPREDNISOLONE 4 MG PO TBPK: ORAL_TABLET | ORAL | 0 refills | Status: DC

## 2021-06-05 MED ORDER — BETAMETHASONE SOD PHOS & ACET 6 (3-3) MG/ML IJ SUSP: 3.0000 mg | Freq: Once | INTRAMUSCULAR | Status: DC

## 2021-06-05 NOTE — Progress Notes (Signed)
   Subjective: 65 y.o. female presenting for an acute flareup of pain to the bilateral feet.  Patient is unsure what would have exacerbated or elicited her symptoms.  She says that her right heel is extremely painful as well as her left posterior ankle.  She has not done anything for treatment.  She presents for further treatment and evaluation.  She has had good success in the past with injections   Past Medical History:  Diagnosis Date   Allergy    Anemia    Asthma, chronic, severe persistent, uncomplicated    Essential hypertension    GERD (gastroesophageal reflux disease)    Hyperlipidemia    Obstructive chronic bronchitis without exacerbation (HCC)    Seasonal allergic rhinitis due to pollen      Objective: Physical Exam General: The patient is alert and oriented x3 in no acute distress.  Dermatology: Skin is warm, dry and supple bilateral lower extremities. Negative for open lesions or macerations bilateral.   Vascular: Dorsalis Pedis and Posterior Tibial pulses palpable bilateral.  Capillary fill time is immediate to all digits.  Neurological: Epicritic and protective threshold intact bilateral.   Musculoskeletal: Tenderness to palpation to the plantar aspect of the right heel along the plantar fascia and along the posterior tibial tendon sheath of the left foot and ankle. All other joints range of motion within normal limits bilateral. Strength 5/5 in all groups bilateral.    Assessment: 1. Plantar fasciitis right 2.  Posterior tibial tendinitis left 3.  Hallux valgus left  Plan of Care:  1. Patient evaluated. Xrays reviewed.   2. Injection of 0.5cc Celestone soluspan injected into the right plantar fascia and the posterior tibial tendon sheath left 3. Rx for Medrol Dose Pack placed 4.  Patient has a prescription for meloxicam at home.  Resume after completion of the Dosepak 5.  Recommend good supportive shoes and sneakers that support the foot 6. Instructed patient  regarding therapies and modalities at home to alleviate symptoms.  7.  Return to clinic as needed when the patient is ready to address her left bunion.  She says that over the past year it has become increasingly painful and she would like to eventually have surgery to get it corrected.  X-rays next visit  *Going to [place] tomorrow morning   Edrick Kins, DPM [facility]  Dr. Edrick Kins, DPM    [address], [place] [address]                Office [phone]  Fax [phone]

## 2021-07-24 ENCOUNTER — Ambulatory Visit
Admission: RE | Admit: 2021-07-24 | Discharge: 2021-07-24 | Disposition: A | Discharge status: Home / Self Care | Payer: Medicare Other | Source: Ambulatory Visit | Attending: Pulmonary Disease | Admitting: Pulmonary Disease

## 2021-07-24 ENCOUNTER — Other Ambulatory Visit
Admission: RE | Admit: 2021-07-24 | Discharge: 2021-07-24 | Disposition: A | Discharge status: Home / Self Care | Payer: Medicare Other | Source: Ambulatory Visit | Attending: Pulmonary Disease | Admitting: Pulmonary Disease

## 2021-07-24 ENCOUNTER — Other Ambulatory Visit: Payer: Self-pay | Admitting: Pulmonary Disease

## 2021-07-24 ENCOUNTER — Other Ambulatory Visit (HOSPITAL_COMMUNITY): Payer: Self-pay | Admitting: Pulmonary Disease

## 2021-07-24 ENCOUNTER — Other Ambulatory Visit: Payer: Self-pay

## 2021-07-24 DIAGNOSIS — M79605 Pain in left leg: Secondary | ICD-10-CM | POA: Diagnosis present

## 2021-07-24 DIAGNOSIS — J449 Chronic obstructive pulmonary disease, unspecified: Secondary | ICD-10-CM | POA: Insufficient documentation

## 2021-07-24 DIAGNOSIS — M7989 Other specified soft tissue disorders: Secondary | ICD-10-CM

## 2021-07-24 DIAGNOSIS — M79604 Pain in right leg: Secondary | ICD-10-CM | POA: Diagnosis present

## 2021-07-24 DIAGNOSIS — R079 Chest pain, unspecified: Secondary | ICD-10-CM | POA: Insufficient documentation

## 2021-07-24 DIAGNOSIS — J4550 Severe persistent asthma, uncomplicated: Secondary | ICD-10-CM | POA: Insufficient documentation

## 2021-07-24 DIAGNOSIS — J3081 Allergic rhinitis due to animal (cat) (dog) hair and dander: Secondary | ICD-10-CM | POA: Insufficient documentation

## 2021-07-24 DIAGNOSIS — Z91013 Allergy to seafood: Secondary | ICD-10-CM | POA: Insufficient documentation

## 2021-07-24 DIAGNOSIS — J301 Allergic rhinitis due to pollen: Secondary | ICD-10-CM | POA: Insufficient documentation

## 2021-07-24 DIAGNOSIS — J309 Allergic rhinitis, unspecified: Secondary | ICD-10-CM | POA: Insufficient documentation

## 2021-07-24 DIAGNOSIS — I10 Essential (primary) hypertension: Secondary | ICD-10-CM | POA: Insufficient documentation

## 2021-07-24 LAB — D-DIMER, QUANTITATIVE: D-Dimer, Quant: 0.58 ug/mL-FEU — ABNORMAL HIGH (ref 0.00–0.50)

## 2021-10-08 ENCOUNTER — Ambulatory Visit: Payer: Medicare Other | Admitting: Internal Medicine

## 2022-02-18 ENCOUNTER — Emergency Department
Admission: EM | Admit: 2022-02-18 | Discharge: 2022-02-18 | Disposition: A | Discharge status: Home / Self Care | Payer: Medicare Other | Attending: Emergency Medicine | Admitting: Emergency Medicine

## 2022-02-18 ENCOUNTER — Other Ambulatory Visit: Payer: Self-pay

## 2022-02-18 ENCOUNTER — Emergency Department: Payer: Medicare Other

## 2022-02-18 DIAGNOSIS — G4489 Other headache syndrome: Secondary | ICD-10-CM | POA: Insufficient documentation

## 2022-02-18 DIAGNOSIS — I10 Essential (primary) hypertension: Secondary | ICD-10-CM | POA: Insufficient documentation

## 2022-02-18 DIAGNOSIS — R030 Elevated blood-pressure reading, without diagnosis of hypertension: Secondary | ICD-10-CM

## 2022-02-18 DIAGNOSIS — R519 Headache, unspecified: Secondary | ICD-10-CM | POA: Diagnosis present

## 2022-02-18 LAB — COMPREHENSIVE METABOLIC PANEL
ALT: 15 U/L (ref 0–44)
AST: 23 U/L (ref 15–41)
Albumin: 3.8 g/dL (ref 3.5–5.0)
Alkaline Phosphatase: 70 U/L (ref 38–126)
Anion gap: 6 (ref 5–15)
BUN: 6 mg/dL — ABNORMAL LOW (ref 8–23)
CO2: 28 mmol/L (ref 22–32)
Calcium: 9.2 mg/dL (ref 8.9–10.3)
Chloride: 105 mmol/L (ref 98–111)
Creatinine, Ser: 0.54 mg/dL (ref 0.44–1.00)
GFR, Estimated: >60 mL/min (ref >60)
Glucose, Bld: 88 mg/dL (ref 70–99)
Potassium: 3.5 mmol/L (ref 3.5–5.1)
Sodium: 139 mmol/L (ref 135–145)
Total Bilirubin: 0.7 mg/dL (ref 0.3–1.2)
Total Protein: 7 g/dL (ref 6.5–8.1)

## 2022-02-18 LAB — CBC WITH DIFFERENTIAL/PLATELET
Abs Immature Granulocytes: 0.01 10*3/uL (ref 0.00–0.07)
Basophils Absolute: 0 10*3/uL (ref 0.0–0.1)
Basophils Relative: 1 %
Eosinophils Absolute: 0.2 10*3/uL (ref 0.0–0.5)
Eosinophils Relative: 5 %
HCT: 42.6 % (ref 36.0–46.0)
Hemoglobin: 14 g/dL (ref 12.0–15.0)
Immature Granulocytes: 0 %
Lymphocytes Relative: 34 %
Lymphs Abs: 1.5 10*3/uL (ref 0.7–4.0)
MCH: 28.6 pg (ref 26.0–34.0)
MCHC: 32.9 g/dL (ref 30.0–36.0)
MCV: 86.9 fL (ref 80.0–100.0)
Monocytes Absolute: 0.5 10*3/uL (ref 0.1–1.0)
Monocytes Relative: 10 %
Neutro Abs: 2.1 10*3/uL (ref 1.7–7.7)
Neutrophils Relative %: 50 %
Platelets: 182 10*3/uL (ref 150–400)
RBC: 4.9 MIL/uL (ref 3.87–5.11)
RDW: 13.2 % (ref 11.5–15.5)
WBC: 4.3 10*3/uL (ref 4.0–10.5)
nRBC: 0 % (ref 0.0–0.2)

## 2022-02-18 LAB — TROPONIN I (HIGH SENSITIVITY): Troponin I (High Sensitivity): 2 ng/L (ref <18)

## 2022-02-18 MED ORDER — BUTALBITAL-APAP-CAFFEINE 50-325-40 MG PO TABS
1.0000 | ORAL_TABLET | Freq: Four times a day (QID) | ORAL | 0 refills | Status: AC | PRN
Start: 2022-02-18 — End: 2023-02-18

## 2022-02-18 NOTE — Discharge Instructions (Signed)
Call [facility] to see if any of the doctors are taking new patients and internal medicine.  Also have your friend take your blood pressure once a week to see if it is elevated consistently.  Your blood pressure at the time of discharge is 151/86.  Continue taking your medications as prescribed by your doctor.  Return to the emergency department if any severe worsening of your headache, nausea, vomiting, change in vision, shortness of breath or chest pain.

## 2022-02-18 NOTE — ED Provider Notes (Signed)
[facility] Provider Note    Event Date/Time   First MD Initiated Contact with Patient 02/18/22 1119     (approximate)   History   Blurred Vision   HPI  Krista Robinson is a 66 y.o. female   presents to the ED with complaint of headache and elevated blood pressure since Friday.  Patient states that she had 1 episode of blurred vision but denies any dizziness.  She states the blurred vision has resolved at this time.  She denies any nausea, vomiting with the symptoms.  Patient was seen at [facility] acute care this morning and told to come to the emergency department for further evaluation.  Patient reports that even though she has a diagnosis of hypertension in her chart she currently does not take any blood pressure medication.  She states that she was never placed on blood pressure medication.  She has recently been put on furosemide 20 mg daily for edema in her lower extremity by her pulmonologist.  She states that first thing in the morning when she wakes up there is no edema present around the ankle area however after she has been up for approximately 2 hours there is swelling present.      Physical Exam   Triage Vital Signs: ED Triage Vitals  Enc Vitals Group     BP 02/18/22 1056 (!) 141/95     Pulse Rate 02/18/22 1056 (!) 57     Resp 02/18/22 1056 18     Temp 02/18/22 1056 98.2 F (36.8 C)     Temp Source 02/18/22 1056 Oral     SpO2 02/18/22 1056 95 %     Weight 02/18/22 1057 170 lb (77.1 kg)     Height 02/18/22 1057 '5\' 3"'$  (1.6 m)     Head Circumference --      Peak Flow --      Pain Score 02/18/22 1056 5     Pain Loc --      Pain Edu? --      Excl. in [place]? --     Most recent vital signs: Vitals:   02/18/22 1213 02/18/22 1230  BP: (!) 163/83 (!) 151/86  Pulse: (!) 51 (!) 51  Resp: 18   Temp:    SpO2: 100% 99%     General: Awake, no distress.  Alert, oriented, talkative, speech is normal. CV:  Good peripheral perfusion.  Heart  regular rate and rhythm. Resp:  Normal effort.  Clear bilaterally. Abd:  No distention.  Other:  Moves upper and lower extremities that any difficulty.  No gross edema noted lower extremities.  Patient is ambulatory without any assistance.   ED Results / Procedures / Treatments   Labs (all labs ordered are listed, but only abnormal results are displayed) Labs Reviewed  COMPREHENSIVE METABOLIC PANEL - Abnormal; Notable for the following components:      Result Value   BUN 6 (*)    All other components within normal limits  CBC WITH DIFFERENTIAL/PLATELET  TROPONIN I (HIGH SENSITIVITY)     EKG Sinus bradycardia Vent. rate 51 BPM PR interval 150 ms QRS duration 68 ms QT/QTcB 436/401 ms P-R-T axes 70 52 55   RADIOLOGY  CT head without contrast images reviewed by myself and radiology report is negative for any acute intracranial abnormalities.   PROCEDURES:  Critical Care performed:   Procedures   MEDICATIONS ORDERED IN ED: Medications - No data to display   IMPRESSION / MDM /  ASSESSMENT AND PLAN / ED COURSE  I reviewed the triage vital signs and the nursing notes.   Differential diagnosis includes, but is not limited to, elevated blood pressure, dizziness, headache, resolved episode of blurred vision, rule out acute intracranial changes.  66 year old female presents to the ED from [facility] with complaint of headache and elevated blood pressure for approximately 3 days.  Patient states that she had an episode of blurred vision which has now resolved.  She denies any nausea, vomiting, shortness of breath, chest pain with this episode.  EKG showed sinus bradycardia and looking through her records at [facility] she has had bradycardia consistently in the past.  CT scan was reassuring and patient was made aware that there was no acute intracranial abnormalities.  Blood pressure on arrival was 141/95 and noted to have a blood pressure at [facility] of 141/79.   We talked about patient's blood pressure and that blood pressures were very each time that is taken.  Also with the fact that she has a headache and is anxious about her elevated blood pressure would also cause her blood pressure to be above normal.  She agrees to have her friend who is a nurse to take her blood pressure once or twice per week to see if it is consistently staying up.  Patient reports that she had oxycodone while having her leg surgery and did not like the way it made her feel.  She is willing to try Fioricet for her headache as I do not feel like she will get any dysphoria from this medication.  She is to return to the emergency department if any severe worsening of her headache, acute vision changes associated with nausea and vomiting.      Patient's presentation is most consistent with acute complicated illness / injury requiring diagnostic workup.  FINAL CLINICAL IMPRESSION(S) / ED DIAGNOSES   Final diagnoses:  Other headache syndrome  Elevated blood pressure reading     Rx / DC Orders   ED Discharge Orders          Ordered    butalbital-acetaminophen-caffeine (FIORICET) 50-325-40 MG tablet  Every 6 hours PRN        02/18/22 1240             Note:  This document was prepared using Dragon voice recognition software and may include unintentional dictation errors.   Johnn Hai, PA-C 02/18/22 1538    Lavonia Drafts, MD 02/18/22 1540

## 2022-02-18 NOTE — ED Provider Triage Note (Signed)
Emergency Medicine Provider Triage Evaluation Note  Krista Robinson , a 66 y.o. female  was evaluated in triage.  Pt complains of headaches. Gradually worsening. Noticed elevated BP- pain is getting better.   Review of Systems  Positive: Headaches  Negative: Fever   Physical Exam  There were no vitals taken for this visit. Gen:   Awake, no distress   Resp:  Normal effort  MSK:   Moves extremities without difficulty  Other:    Medical Decision Making  Medically screening exam initiated at 10:55 AM.  Appropriate orders placed.  Krista Robinson was informed that the remainder of the evaluation will be completed by another provider, this initial triage assessment does not replace that evaluation, and the importance of remaining in the ED until their evaluation is complete.  CT labs    Vanessa Hickory, MD 02/18/22 1101

## 2022-02-18 NOTE — ED Notes (Signed)
Patient discharged to home per MD order. Patient in stable condition, and deemed medically cleared by ED provider for discharge. Discharge instructions reviewed with patient/family using "Teach Back"; verbalized understanding of medication education and administration, and information about follow-up care. Denies further concerns. ° °

## 2022-02-18 NOTE — ED Triage Notes (Signed)
Pt in with co headache and high bp since Friday. Pt states she is not dizzy but has had episodes of blurry vision since. Denies any blurry vision at this time.

## 2022-04-04 ENCOUNTER — Other Ambulatory Visit: Payer: Self-pay | Admitting: Infectious Diseases

## 2022-04-04 DIAGNOSIS — Z1231 Encounter for screening mammogram for malignant neoplasm of breast: Secondary | ICD-10-CM

## 2022-04-25 ENCOUNTER — Ambulatory Visit
Admission: RE | Admit: 2022-04-25 | Discharge: 2022-04-25 | Disposition: A | Discharge status: Home / Self Care | Payer: Medicare Other | Source: Ambulatory Visit | Attending: Infectious Diseases | Admitting: Infectious Diseases

## 2022-04-25 DIAGNOSIS — Z1231 Encounter for screening mammogram for malignant neoplasm of breast: Secondary | ICD-10-CM | POA: Diagnosis not present

## 2022-04-30 ENCOUNTER — Inpatient Hospital Stay
Admission: RE | Admit: 2022-04-30 | Discharge: 2022-04-30 | Disposition: A | Discharge status: Home / Self Care | Payer: Self-pay | Source: Ambulatory Visit | Attending: *Deleted | Admitting: *Deleted

## 2022-04-30 ENCOUNTER — Other Ambulatory Visit: Payer: Self-pay | Admitting: *Deleted

## 2022-04-30 DIAGNOSIS — Z1231 Encounter for screening mammogram for malignant neoplasm of breast: Secondary | ICD-10-CM

## 2022-05-10 ENCOUNTER — Ambulatory Visit: Payer: Medicare Other | Admitting: Podiatry

## 2022-05-10 ENCOUNTER — Ambulatory Visit (INDEPENDENT_AMBULATORY_CARE_PROVIDER_SITE_OTHER): Payer: Medicare Other

## 2022-05-10 DIAGNOSIS — R52 Pain, unspecified: Secondary | ICD-10-CM | POA: Diagnosis not present

## 2022-05-10 DIAGNOSIS — M7752 Other enthesopathy of left foot: Secondary | ICD-10-CM

## 2022-05-10 DIAGNOSIS — M76822 Posterior tibial tendinitis, left leg: Secondary | ICD-10-CM

## 2022-05-11 DIAGNOSIS — M7752 Other enthesopathy of left foot: Secondary | ICD-10-CM

## 2022-05-11 MED ORDER — BETAMETHASONE SOD PHOS & ACET 6 (3-3) MG/ML IJ SUSP
3.0000 mg | Freq: Once | INTRAMUSCULAR | Status: AC
  Administered 2022-05-11: 3 mg via INTRA_ARTICULAR

## 2022-05-11 MED ORDER — MELOXICAM 15 MG PO TABS: 15.0000 mg | ORAL_TABLET | Freq: Every day | ORAL | 1 refills | Status: DC

## 2022-05-11 NOTE — Progress Notes (Signed)
   Chief Complaint  Patient presents with   Foot Pain    Patient is here for left foot pain.    Subjective: 66 y.o. female presenting for evaluation of chronic left ankle pain this been going on for several years.  Patient does have a history of ORIF left ankle.  Patient states that she was seen by another physician who recommended arthrodesis or arthroplasty with implant to the left ankle.  Patient does not want to pursue any surgical options.  She presents really for a second opinion and further treatment and evaluation  Past Medical History:  Diagnosis Date   Allergy    Anemia    Asthma, chronic, severe persistent, uncomplicated    Essential hypertension    GERD (gastroesophageal reflux disease)    Hyperlipidemia    Obstructive chronic bronchitis without exacerbation (HCC)    Seasonal allergic rhinitis due to pollen    Past Surgical History:  Procedure Laterality Date   CESAREAN SECTION     INTUBATION NASOTRACHEAL     1991 and 1995   Allergies  Allergen Reactions   Iodine    Shellfish Allergy    Sulfa Antibiotics      Objective: Physical Exam General: The patient is alert and oriented x3 in no acute distress.  Dermatology: Skin is warm, dry and supple bilateral lower extremities. Negative for open lesions or macerations bilateral.   Vascular: Dorsalis Pedis and Posterior Tibial pulses palpable bilateral.  Capillary fill time is immediate to all digits.  Neurological: Epicritic and protective threshold intact bilateral.   Musculoskeletal: Pain on palpation noted to the left ankle with limited range of motion with crepitus as well.  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  Radiographic exam LT ankle 05/10/2022: Orthopedic hardware appears intact.  Intramedullary rod tibia and fibular plate fibula appears stable and intact.  Degenerative changes noted to the left tibiotalar joint.  The joint is congruent.  Assessment: 1.   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 Patient evaluated. Xrays reviewed.   2.  Today we discussed different treatment options for the patient both surgical and conservative.  I do believe that the patient would benefit eventually from either arthrodesis or arthroplasty with implant.  For now we are going to pursue conservative treatment since the patient is not looking for any surgical options at the moment. 3.  Injection of 0.5 cc Celestone Soluspan injected into the left ankle joint 4.  Prescription for meloxicam 15 mg daily 5.  Recommend good supportive shoes and sneakers.  Advised against going barefoot 6.  Return to clinic as needed   Edrick Kins, DPM [facility]  Dr. Edrick Kins, DPM    [address], [place] [address]                Office [phone]  Fax [phone]

## 2022-05-19 IMAGING — US US EXTREM LOW VENOUS
1 series · 14 of 24 positions shown · non-contrast
Comparison: None.

CLINICAL DATA: Bilateral lower extremity pain and edema

Chest pain
EXAM:
BILATERAL LOWER EXTREMITY VENOUS DOPPLER ULTRASOUND
TECHNIQUE: Gray-scale sonography with compression, as well as color and duplex
ultrasound, were performed to evaluate the deep venous system(s)
from the level of the common femoral vein through the popliteal and
proximal calf veins.

[Series 1: us venous img lower bilat (dvt) · portal-venous · 14 of 56 slices shown]
[im 1/56]
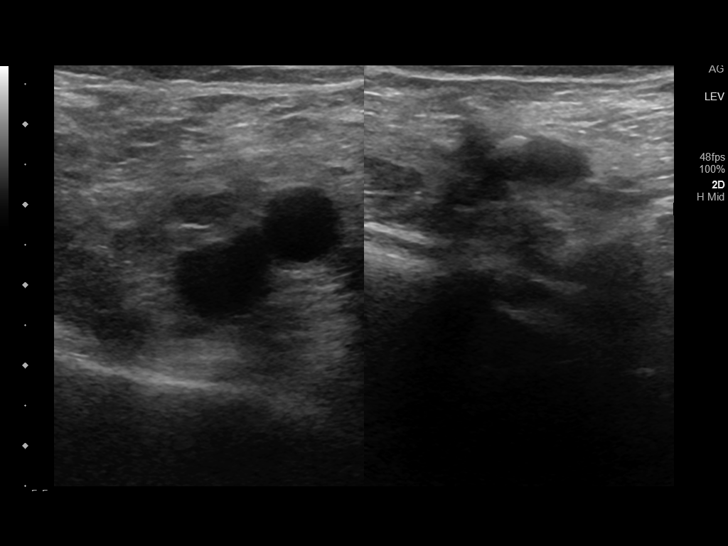
[im 5/56]
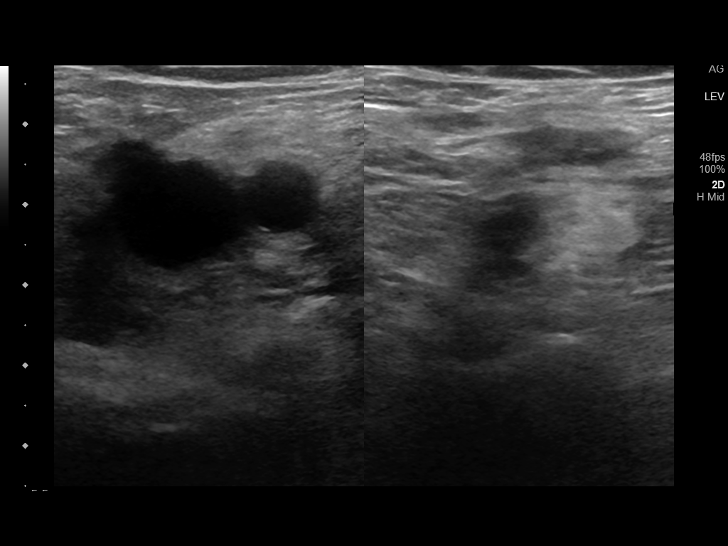
[im 10/56]
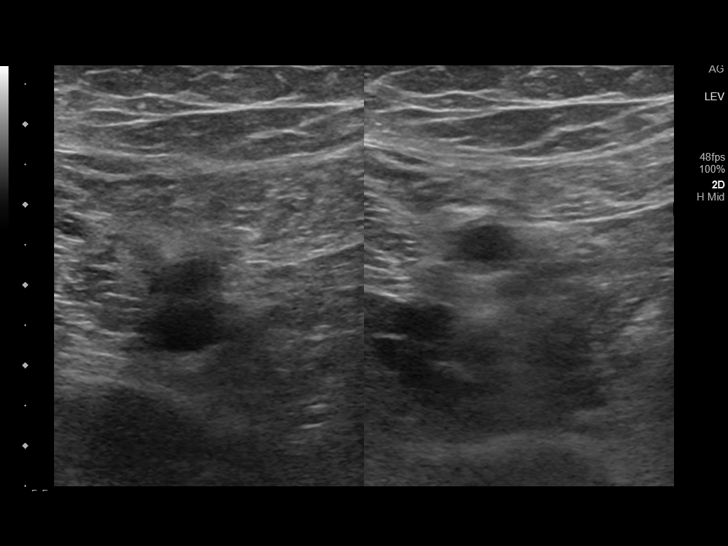
[im 15/56]
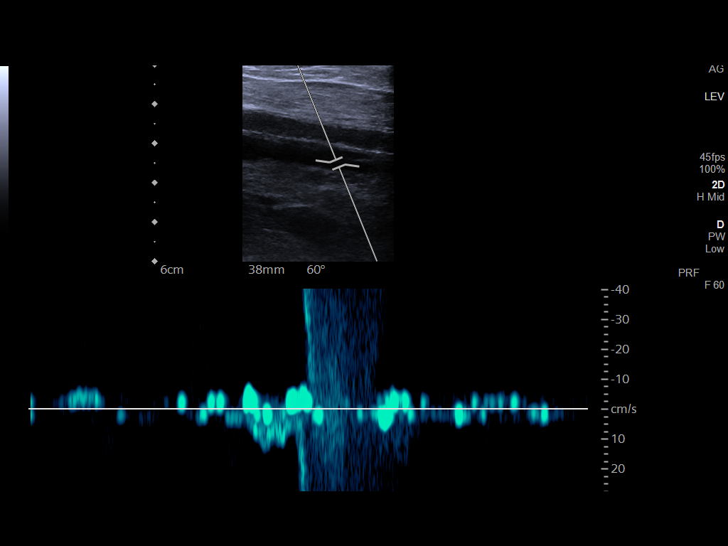
[im 17/56]
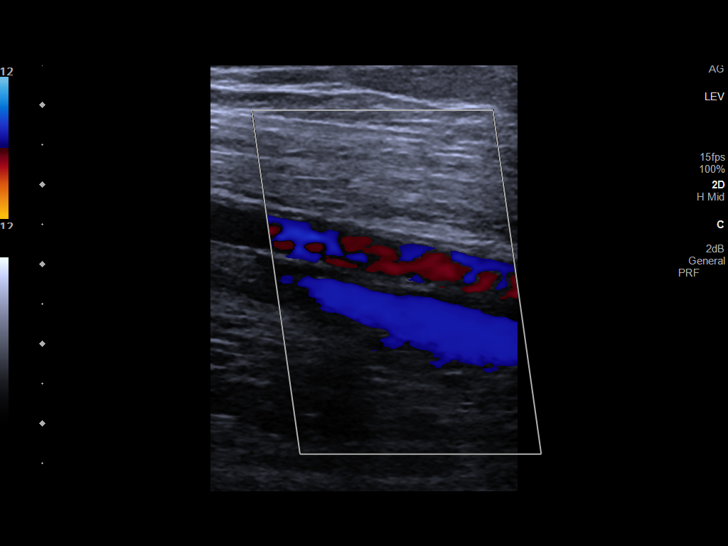
[im 22/56]
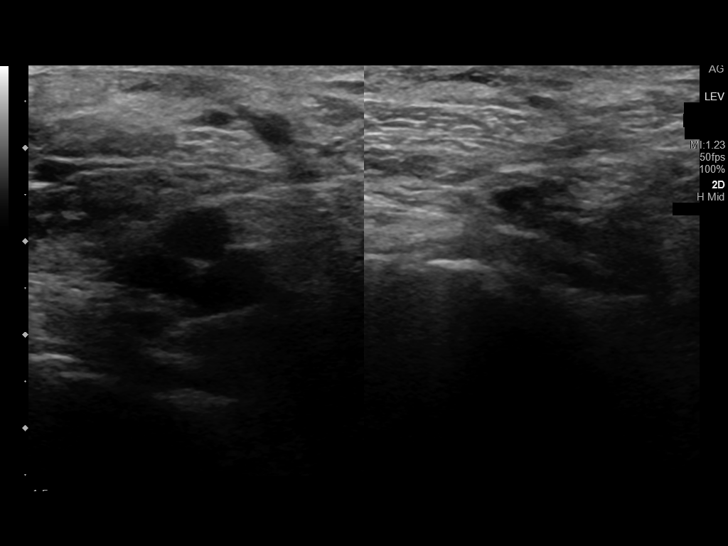
[im 27/56]
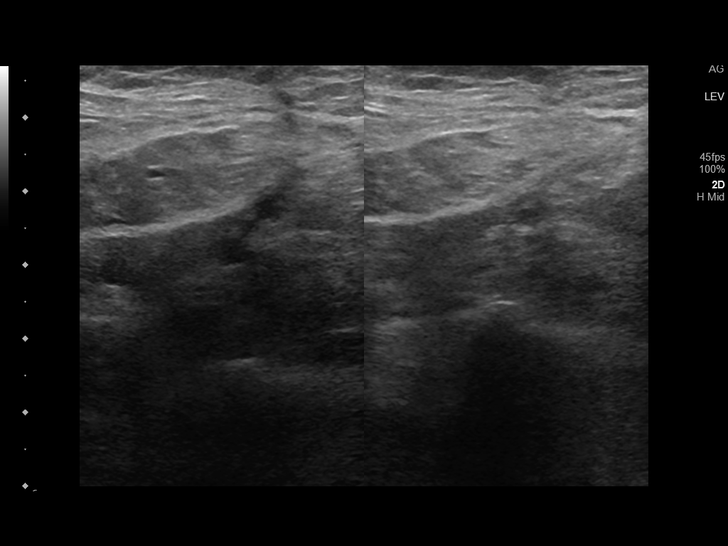
[im 29/56]
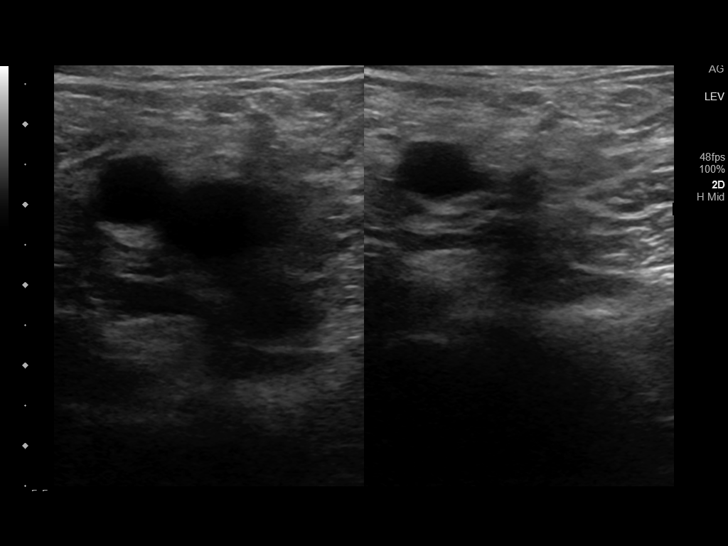
[im 34/56]
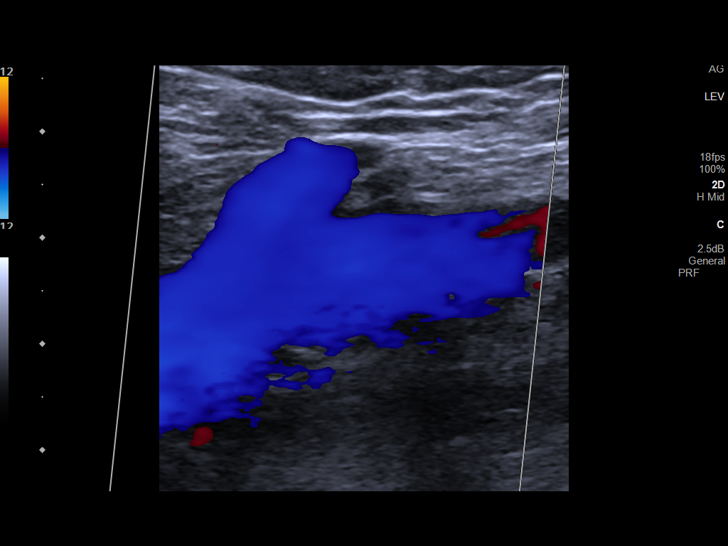
[im 39/56]
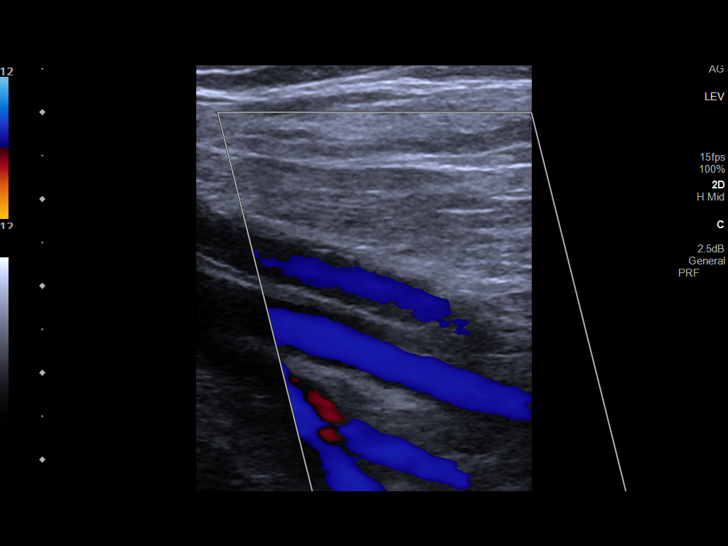
[im 44/56]
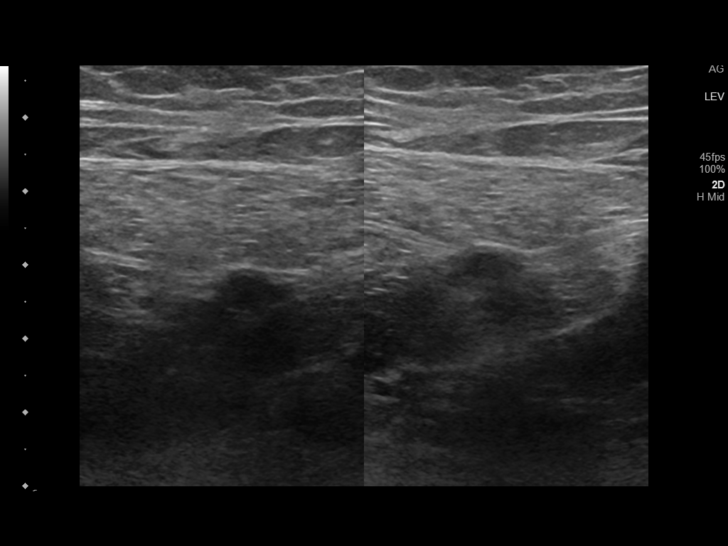
[im 46/56]
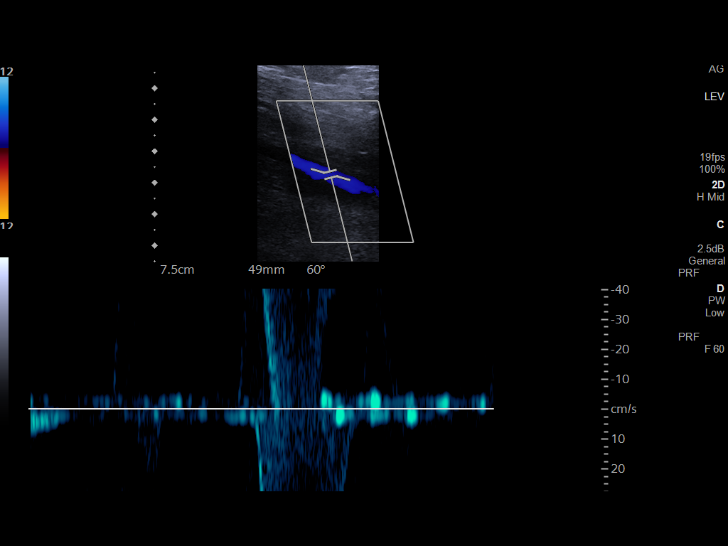
[im 51/56]
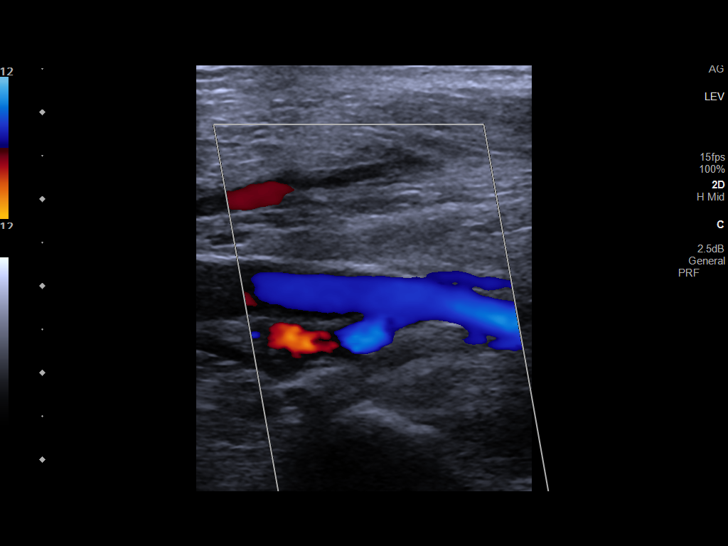
[im 56/56]
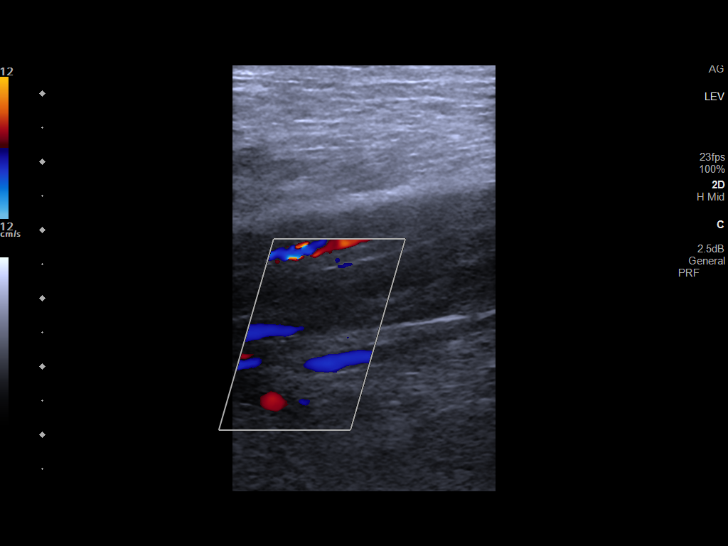

[14 of 24 positions shown; findings below may reference images not displayed]

FINDINGS: VENOUS

Normal compressibility of the common femoral, superficial femoral,
and popliteal veins, as well as the visualized calf veins.
Visualized portions of profunda femoral vein and great saphenous
vein unremarkable. No filling defects to suggest DVT on grayscale or
color Doppler imaging. Doppler waveforms show normal direction of
venous flow, normal respiratory plasticity and response to
augmentation.

OTHER

None.

Limitations: none
IMPRESSION: No lower extremity DVT.

## 2022-06-14 ENCOUNTER — Ambulatory Visit: Payer: Medicare Other | Admitting: Podiatry

## 2022-06-24 ENCOUNTER — Ambulatory Visit: Payer: Medicare Other | Admitting: Surgery

## 2022-07-22 ENCOUNTER — Ambulatory Visit: Payer: Medicare Other | Admitting: Surgery
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
## 2022-10-28 ENCOUNTER — Encounter: Payer: Self-pay | Admitting: Gastroenterology

## 2022-10-28 ENCOUNTER — Encounter
Admission: RE | Disposition: A | Discharge status: Home / Self Care | Payer: Self-pay | Source: Home / Self Care | Attending: Gastroenterology

## 2022-10-28 ENCOUNTER — Ambulatory Visit: Payer: Medicare Other | Admitting: Certified Registered"

## 2022-10-28 ENCOUNTER — Ambulatory Visit
Admission: RE | Admit: 2022-10-28 | Discharge: 2022-10-28 | Disposition: A | Discharge status: Home / Self Care | Payer: Medicare Other | Attending: Gastroenterology | Admitting: Gastroenterology

## 2022-10-28 DIAGNOSIS — D125 Benign neoplasm of sigmoid colon: Secondary | ICD-10-CM | POA: Insufficient documentation

## 2022-10-28 DIAGNOSIS — J449 Chronic obstructive pulmonary disease, unspecified: Secondary | ICD-10-CM | POA: Diagnosis not present

## 2022-10-28 DIAGNOSIS — D123 Benign neoplasm of transverse colon: Secondary | ICD-10-CM | POA: Insufficient documentation

## 2022-10-28 DIAGNOSIS — D122 Benign neoplasm of ascending colon: Secondary | ICD-10-CM | POA: Insufficient documentation

## 2022-10-28 DIAGNOSIS — K219 Gastro-esophageal reflux disease without esophagitis: Secondary | ICD-10-CM | POA: Insufficient documentation

## 2022-10-28 DIAGNOSIS — Z87891 Personal history of nicotine dependence: Secondary | ICD-10-CM | POA: Insufficient documentation

## 2022-10-28 DIAGNOSIS — K2270 Barrett's esophagus without dysplasia: Secondary | ICD-10-CM | POA: Diagnosis not present

## 2022-10-28 DIAGNOSIS — Z1211 Encounter for screening for malignant neoplasm of colon: Secondary | ICD-10-CM | POA: Diagnosis present

## 2022-10-28 DIAGNOSIS — K640 First degree hemorrhoids: Secondary | ICD-10-CM | POA: Insufficient documentation

## 2022-10-28 DIAGNOSIS — I10 Essential (primary) hypertension: Secondary | ICD-10-CM | POA: Diagnosis not present

## 2022-10-28 DIAGNOSIS — Z800 Family history of malignant neoplasm of digestive organs: Secondary | ICD-10-CM | POA: Diagnosis not present

## 2022-10-28 DIAGNOSIS — Z86010 Personal history of colonic polyps: Secondary | ICD-10-CM | POA: Insufficient documentation

## 2022-10-28 DIAGNOSIS — K449 Diaphragmatic hernia without obstruction or gangrene: Secondary | ICD-10-CM | POA: Diagnosis not present

## 2022-10-28 DIAGNOSIS — D120 Benign neoplasm of cecum: Secondary | ICD-10-CM | POA: Diagnosis not present

## 2022-10-28 DIAGNOSIS — K5730 Diverticulosis of large intestine without perforation or abscess without bleeding: Secondary | ICD-10-CM | POA: Insufficient documentation

## 2022-10-28 HISTORY — PX: COLONOSCOPY: SHX5424

## 2022-10-28 HISTORY — PX: ESOPHAGOGASTRODUODENOSCOPY: SHX5428

## 2022-10-28 SURGERY — COLONOSCOPY
Anesthesia: General

## 2022-10-28 MED ORDER — PROPOFOL 10 MG/ML IV BOLUS
INTRAVENOUS | Status: DC | PRN
  Administered 2022-10-28: 150 ug/kg/min via INTRAVENOUS
  Administered 2022-10-28: 100 mg via INTRAVENOUS
  Administered 2022-10-28 (×2): 50 mg via INTRAVENOUS

## 2022-10-28 MED ORDER — SODIUM CHLORIDE 0.9 % IV SOLN: INTRAVENOUS | Status: DC

## 2022-10-28 NOTE — Interval H&P Note (Signed)
History and Physical Interval Note: Preprocedure H&P from 10/28/22  was reviewed and there was no interval change after seeing and examining the patient.  Written consent was obtained from the patient after discussion of risks, benefits, and alternatives. Patient has consented to proceed with Esophagogastroduodenoscopy and Colonoscopy with possible intervention   10/28/2022 1:26 PM  Krista Robinson  has presented today for surgery, with the diagnosis of Gastroesophageal reflux disease, unspecified whether esophagitis present (K21.9) Barrett's esophagus without dysplasia (K22.70) Hx of colonic polyps (Z86.010).  The various methods of treatment have been discussed with the patient and family. After consideration of risks, benefits and other options for treatment, the patient has consented to  Procedure(s): COLONOSCOPY (N/A) ESOPHAGOGASTRODUODENOSCOPY (EGD) (N/A) as a surgical intervention.  The patient's history has been reviewed, patient examined, no change in status, stable for surgery.  I have reviewed the patient's chart and labs.  Questions were answered to the patient's satisfaction.     Annamaria Helling

## 2022-10-28 NOTE — Progress Notes (Signed)
Patient seen and spoken to post-operatively.  C/o chest discomfort with deep breaths just above the xiphoid process. This is consistent with paraesophageal hernia being insufflated. She is able to speak in full sentences. Encouraging patient to belch likely trapped air.  Vital signs are normal- BP 155/87  HR 74 Sat > 92% on RA  Should sx persist, can order CXR and EKG and d/w anesthesida.  Ronne Binning, [facility]

## 2022-10-28 NOTE — Transfer of Care (Signed)
Immediate Anesthesia Transfer of Care Note  Patient: Krista Robinson  Procedure(s) Performed: COLONOSCOPY ESOPHAGOGASTRODUODENOSCOPY (EGD)  Patient Location: PACU  Anesthesia Type:General  Level of Consciousness: awake and alert   Airway & Oxygen Therapy: Patient Spontanous Breathing  Post-op Assessment: Report given to RN and Post -op Vital signs reviewed and stable  Post vital signs: Reviewed and stable  Last Vitals:  Vitals Value Taken Time  BP    Temp    Pulse 86 10/28/22 1421  Resp 29 10/28/22 1421  SpO2 97 % 10/28/22 1421  Vitals shown include unvalidated device data.  Last Pain:  Vitals:   10/28/22 1240  TempSrc: Temporal  PainSc: 0-No pain         Complications: No notable events documented.

## 2022-10-28 NOTE — Op Note (Signed)
[facility] Gastroenterology Patient Name: Krista Robinson Procedure Date: 10/28/2022 1:23 PM MRN: [id number] Account #: [id number] Date of Birth: 10/11/1955 Admit Type: Outpatient Age: 67 Room: [facility] ENDO ROOM 2 Gender: Female Note Status: Finalized Instrument Name: Upper Endoscope [id number] Procedure:             Upper GI endoscopy Indications:           Surveillance for malignancy due to personal history of                         Barrett's esophagus Providers:             Annamaria Helling DO, DO Medicines:             Monitored Anesthesia Care Complications:         No immediate complications. Estimated blood loss:                         Minimal. Procedure:             Pre-Anesthesia Assessment:                        - Prior to the procedure, a History and Physical was                         performed, and patient medications and allergies were                         reviewed. The patient is competent. The risks and                         benefits of the procedure and the sedation options and                         risks were discussed with the patient. All questions                         were answered and informed consent was obtained.                         Patient identification and proposed procedure were                         verified by the physician, the nurse, the anesthetist                         and the technician in the endoscopy suite. Mental                         Status Examination: alert and oriented. Airway                         Examination: normal oropharyngeal airway and neck                         mobility. Respiratory Examination: clear to                         auscultation. CV Examination: RRR, no murmurs, no S3  or S4. Prophylactic Antibiotics: The patient does not                         require prophylactic antibiotics. Prior                         Anticoagulants: The patient has taken no  anticoagulant                         or antiplatelet agents. ASA Grade Assessment: III - A                         patient with severe systemic disease. After reviewing                         the risks and benefits, the patient was deemed in                         satisfactory condition to undergo the procedure. The                         anesthesia plan was to use monitored anesthesia care                         (MAC). Immediately prior to administration of                         medications, the patient was re-assessed for adequacy                         to receive sedatives. The heart rate, respiratory                         rate, oxygen saturations, blood pressure, adequacy of                         pulmonary ventilation, and response to care were                         monitored throughout the procedure. The physical                         status of the patient was re-assessed after the                         procedure.                        After obtaining informed consent, the endoscope was                         passed under direct vision. Throughout the procedure,                         the patient's blood pressure, pulse, and oxygen                         saturations were monitored continuously. The Endoscope  was introduced through the mouth, and advanced to the                         second part of duodenum. The upper GI endoscopy was                         accomplished without difficulty. The patient tolerated                         the procedure well. Findings:      The duodenal bulb, first portion of the duodenum and second portion of       the duodenum were normal.      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 The Z-line was difficult to       ascertain, even under NBI. ~30cm. Hernia approximately 10cm in size      A hiatal hernia was present. Estimated blood loss: none.      The exam of the stomach was otherwise normal.      The  Z-line was unclear due to anatomy and large hernia and was found 30       cm from the incisors. Biopsies were taken with a cold forceps for       histology. around what appeared to be the z-line for histology to rule       out barretts. Biopsies more distal since if appeared more as herniated       gastric folds over change in tissue type.      The exam of the esophagus was otherwise normal. Impression:            - Normal duodenal bulb, first portion of the duodenum                         and second portion of the duodenum.                        - Large paraesophageal hernia.                        - Hiatal hernia.                        - Z-line unclear due to anatomy and large hernia, 30                         cm from the incisors. Biopsied. Recommendation:        - Patient has a contact number available for                         emergencies. The signs and symptoms of potential                         delayed complications were discussed with the patient.                         Return to normal activities tomorrow. Written                         discharge instructions were provided to the patient.                        -  Discharge patient to home.                        - Resume previous diet.                        - Continue present medications.                        - Await pathology results.                        - Refer to a surgeon at appointment to be scheduled.                        - Evaluate for hernia repair.                        - Return to GI clinic as previously scheduled.                        - The findings and recommendations were discussed with                         the patient. Procedure Code(s):     --- Professional ---                        [phone], Esophagogastroduodenoscopy, flexible,                         transoral; with biopsy, single or multiple Diagnosis Code(s):     --- Professional ---                        K22.70, Barrett's esophagus  without dysplasia                        K44.9, Diaphragmatic hernia without obstruction or                         gangrene CPT copyright 2022 American Medical Association. All rights reserved. The codes documented in this report are preliminary and upon coder review may  be revised to meet current compliance requirements. Attending Participation:      I personally performed the entire procedure. Volney American, DO Annamaria Helling DO, DO 10/28/2022 2:33:48 PM This report has been signed electronically. Number of Addenda: 0 Note Initiated On: 10/28/2022 1:23 PM Estimated Blood Loss:  Estimated blood loss was minimal.      [facility]

## 2022-10-28 NOTE — Op Note (Signed)
[facility] Gastroenterology Patient Name: Krista Robinson Procedure Date: 10/28/2022 1:23 PM MRN: [id number] Account #: [id number] Date of Birth: Nov 19, 1955 Admit Type: Outpatient Age: 67 Room: [facility] ENDO ROOM 2 Gender: Female Note Status: Finalized Instrument Name: Colonoscope [id number] Procedure:             Colonoscopy Indications:           High risk colon cancer surveillance: Personal history                         of colonic polyps Providers:             Annamaria Helling DO, DO Medicines:             Monitored Anesthesia Care Complications:         No immediate complications. Estimated blood loss:                         Minimal. Procedure:             Pre-Anesthesia Assessment:                        - Prior to the procedure, a History and Physical was                         performed, and patient medications and allergies were                         reviewed. The patient is competent. The risks and                         benefits of the procedure and the sedation options and                         risks were discussed with the patient. All questions                         were answered and informed consent was obtained.                         Patient identification and proposed procedure were                         verified by the physician, the nurse, the anesthetist                         and the technician in the endoscopy suite. Mental                         Status Examination: alert and oriented. Airway                         Examination: normal oropharyngeal airway and neck                         mobility. Respiratory Examination: clear to                         auscultation. CV Examination: RRR, no murmurs, no S3  or S4. Prophylactic Antibiotics: The patient does not                         require prophylactic antibiotics. Prior                         Anticoagulants: The patient has taken no anticoagulant                          or antiplatelet agents. ASA Grade Assessment: III - A                         patient with severe systemic disease. After reviewing                         the risks and benefits, the patient was deemed in                         satisfactory condition to undergo the procedure. The                         anesthesia plan was to use monitored anesthesia care                         (MAC). Immediately prior to administration of                         medications, the patient was re-assessed for adequacy                         to receive sedatives. The heart rate, respiratory                         rate, oxygen saturations, blood pressure, adequacy of                         pulmonary ventilation, and response to care were                         monitored throughout the procedure. The physical                         status of the patient was re-assessed after the                         procedure.                        After obtaining informed consent, the colonoscope was                         passed under direct vision. Throughout the procedure,                         the patient's blood pressure, pulse, and oxygen                         saturations were monitored continuously. The  Colonoscope was introduced through the anus and                         advanced to the the terminal ileum, with                         identification of the appendiceal orifice and IC                         valve. The colonoscopy was performed without                         difficulty. The patient tolerated the procedure well.                         The quality of the bowel preparation was evaluated                         using the BBPS [facility] Bowel Preparation Scale) with                         scores of: Right Colon = 2 (minor amount of residual                         staining, small fragments of stool and/or opaque                         liquid, but  mucosa seen well), Transverse Colon = 3                         (entire mucosa seen well with no residual staining,                         small fragments of stool or opaque liquid) and Left                         Colon = 2 (minor amount of residual staining, small                         fragments of stool and/or opaque liquid, but mucosa                         seen well). The total BBPS score equals 7. The quality                         of the bowel preparation was good. The terminal ileum,                         ileocecal valve, appendiceal orifice, and rectum were                         photographed. Findings:      The perianal and digital rectal examinations were normal. Pertinent       negatives include normal sphincter tone.      The terminal ileum appeared normal. Estimated blood loss: none.      seed/food residue content limited visualization of part of cecum  that       could not be suctioned up. Estimated blood loss: none.      Multiple small-mouthed diverticula were found in the entire colon.       Estimated blood loss: none.      Six sessile polyps were found in the sigmoid colon, transverse colon,       ascending colon (2) and cecum (2). The polyps were 1 to 2 mm in size.       These polyps were removed with a jumbo cold forceps. Resection and       retrieval were complete. Estimated blood loss was minimal.      Non-bleeding internal hemorrhoids were found during retroflexion. The       hemorrhoids were Grade I (internal hemorrhoids that do not prolapse).       Estimated blood loss: none.      The exam was otherwise without abnormality on direct and retroflexion       views. Impression:            - The examined portion of the ileum was normal.                        - Diverticulosis in the entire examined colon.                        - Six 1 to 2 mm polyps in the sigmoid colon, in the                         transverse colon, in the ascending colon and in the                          cecum, removed with a jumbo cold forceps. Resected and                         retrieved.                        - Non-bleeding internal hemorrhoids.                        - The examination was otherwise normal on direct and                         retroflexion views. Recommendation:        - Patient has a contact number available for                         emergencies. The signs and symptoms of potential                         delayed complications were discussed with the patient.                         Return to normal activities tomorrow. Written                         discharge instructions were provided to the patient.                        - Discharge patient to home.                        -  Resume previous diet.                        - Continue present medications.                        - No aspirin, ibuprofen, naproxen, or other                         non-steroidal anti-inflammatory drugs for 5 days after                         polyp removal.                        - Await pathology results.                        - Repeat colonoscopy for surveillance based on                         pathology results.                        - Return to referring physician as previously                         scheduled.                        - The findings and recommendations were discussed with                         the patient. Procedure Code(s):     --- Professional ---                        [phone], Colonoscopy, flexible; with biopsy, single or                         multiple Diagnosis Code(s):     --- Professional ---                        Z86.010, Personal history of colonic polyps                        K64.0, First degree hemorrhoids                        D12.5, Benign neoplasm of sigmoid colon                        D12.3, Benign neoplasm of transverse colon (hepatic                         flexure or splenic flexure)                        D12.2,  Benign neoplasm of ascending colon                        D12.0, Benign neoplasm of cecum  K57.30, Diverticulosis of large intestine without                         perforation or abscess without bleeding CPT copyright 2022 American Medical Association. All rights reserved. The codes documented in this report are preliminary and upon coder review may  be revised to meet current compliance requirements. Attending Participation:      I personally performed the entire procedure. Volney American, DO Annamaria Helling DO, DO 10/28/2022 2:27:15 PM This report has been signed electronically. Number of Addenda: 0 Note Initiated On: 10/28/2022 1:23 PM Scope Withdrawal Time: 0 hours 19 minutes 33 seconds  Total Procedure Duration: 0 hours 24 minutes 32 seconds  Estimated Blood Loss:  Estimated blood loss was minimal.      [facility]

## 2022-10-28 NOTE — Anesthesia Preprocedure Evaluation (Addendum)
Anesthesia Evaluation  Patient identified by MRN, date of birth, ID band Patient awake    Reviewed: Allergy & Precautions, H&P , NPO status , Patient's Chart, lab work & pertinent test results  Airway Mallampati: II  TM Distance: >3 FB Neck ROM: full    Dental  (+) Upper Dentures   Pulmonary asthma , COPD,  COPD inhaler, former smoker Hospitalized 05/2022 due to "medication issue causing SOB."   Pulmonary exam normal        Cardiovascular Exercise Tolerance: Good hypertension, Normal cardiovascular exam     Neuro/Psych negative neurological ROS  negative psych ROS   GI/Hepatic Neg liver ROS,GERD  ,,  Endo/Other  negative endocrine ROS    Renal/GU negative Renal ROS  negative genitourinary   Musculoskeletal   Abdominal Normal abdominal exam  (+)   Peds  Hematology negative hematology ROS (+)   Anesthesia Other Findings Past Medical History: No date: Allergy No date: Anemia No date: Asthma, chronic, severe persistent, uncomplicated No date: Essential hypertension No date: GERD (gastroesophageal reflux disease) No date: Hyperlipidemia No date: Obstructive chronic bronchitis without exacerbation (HCC) No date: Seasonal allergic rhinitis due to pollen  Past Surgical History: No date: CESAREAN SECTION No date: INTUBATION NASOTRACHEAL     Comment:  1991 and 1995     Reproductive/Obstetrics negative OB ROS                              Anesthesia Physical Anesthesia Plan  ASA: 3  Anesthesia Plan: General   Post-op Pain Management:    Induction: Intravenous  PONV Risk Score and Plan: Propofol infusion and TIVA  Airway Management Planned: Natural Airway  Additional Equipment:   Intra-op Plan:   Post-operative Plan:   Informed Consent: I have reviewed the patients History and Physical, chart, labs and discussed the procedure including the risks, benefits and alternatives for  the proposed anesthesia with the patient or authorized representative who has indicated his/her understanding and acceptance.     Dental Advisory Given  Plan Discussed with: CRNA and Surgeon  Anesthesia Plan Comments:          Anesthesia Quick Evaluation

## 2022-10-29 ENCOUNTER — Encounter: Payer: Self-pay | Admitting: Gastroenterology

## 2022-10-30 LAB — SURGICAL PATHOLOGY

## 2022-11-07 NOTE — Anesthesia Postprocedure Evaluation (Signed)
Anesthesia Post Note  Patient: Krista Robinson  Procedure(s) Performed: COLONOSCOPY ESOPHAGOGASTRODUODENOSCOPY (EGD)  Patient location during evaluation: Endoscopy Anesthesia Type: General Level of consciousness: awake and alert Pain management: pain level controlled Vital Signs Assessment: post-procedure vital signs reviewed and stable Respiratory status: spontaneous breathing, nonlabored ventilation, respiratory function stable and patient connected to nasal cannula oxygen Cardiovascular status: blood pressure returned to baseline and stable Postop Assessment: no apparent nausea or vomiting Anesthetic complications: no   There were no known notable events for this encounter.   Last Vitals:  Vitals:   10/28/22 1432 10/28/22 1442  BP: 136/87 (!) 155/87  Pulse: 80 71  Resp: (!) 25 (!) 25  Temp:    SpO2: 91% 91%    Last Pain:  Vitals:   10/29/22 0734  TempSrc:   PainSc: 1                  Martha Clan

## 2022-12-14 IMAGING — CT CT HEAD W/O CM
4 series · 17 of 47 positions shown, 19 images · non-contrast
Comparison: None Available.

CLINICAL DATA: Headache, new or worsening.  Hypertension.



[Series 2: head wo · axial · 0.46mm/px · z∈[-82,+38]mm · 7 of 33 slices shown, 9 images]
[im 5/33  brain]
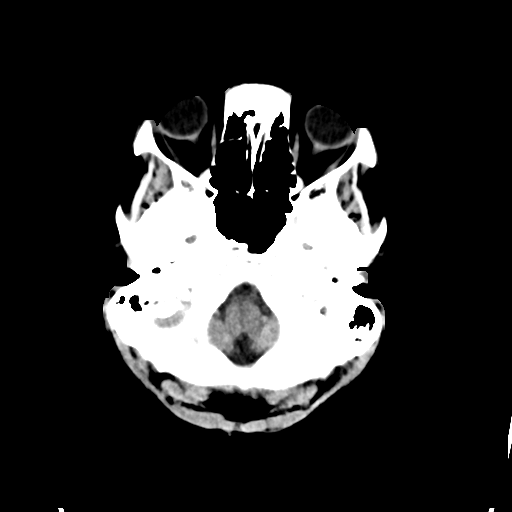
[im 5/33  bone]
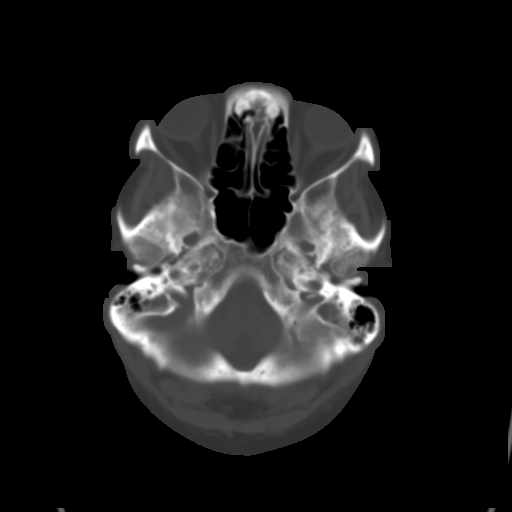
[im 9/33  brain]
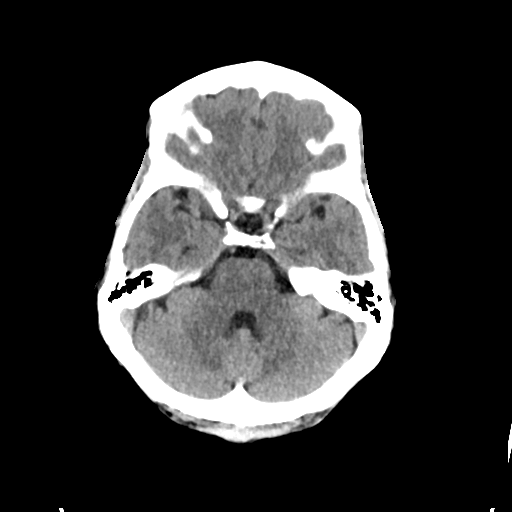
[im 13/33  brain]
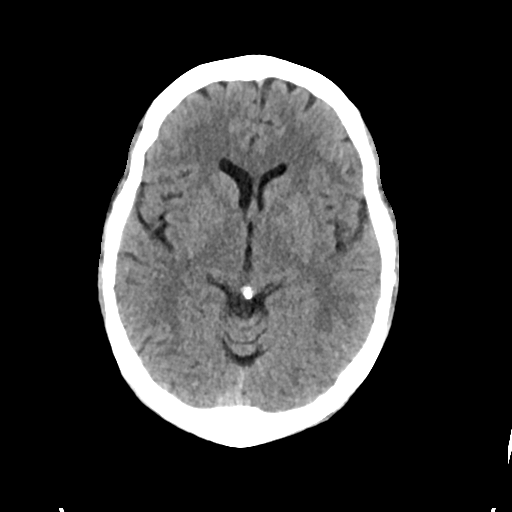
[im 17/33  brain]
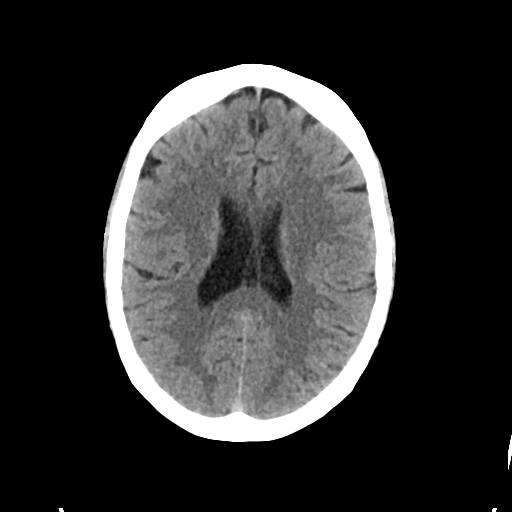
[im 21/33  brain]
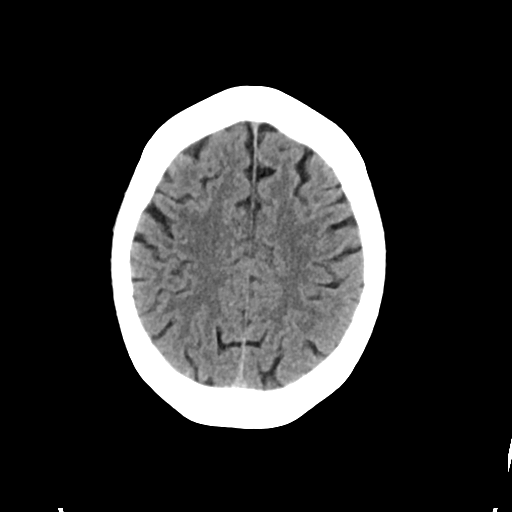
[im 21/33  bone]
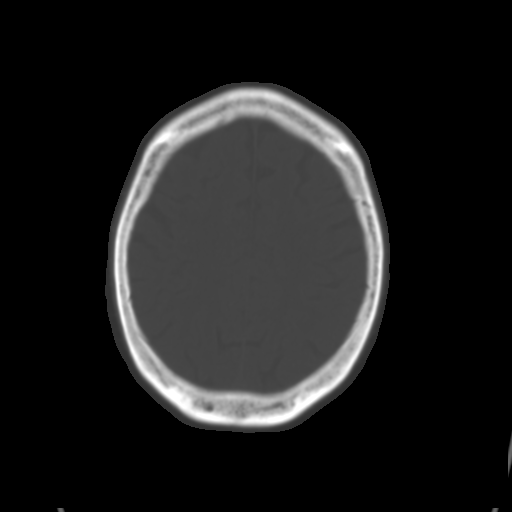
[im 25/33  brain]
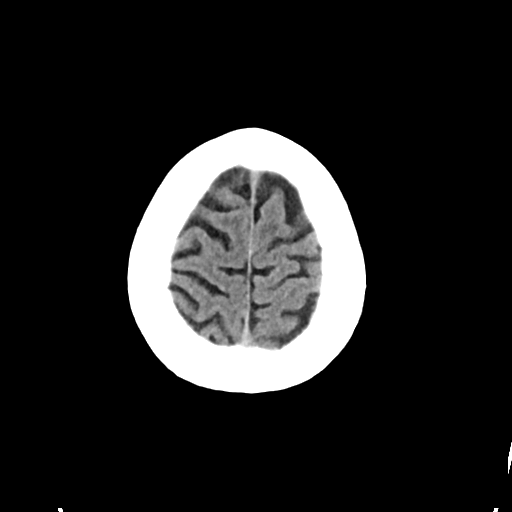
[im 29/33  brain]
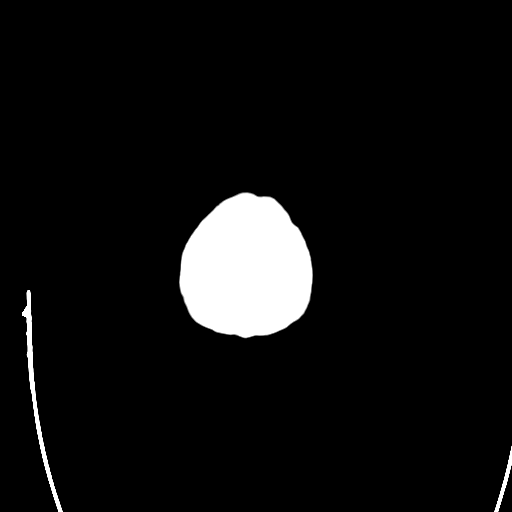

[Series 3: head bone · axial · 0.46mm/px · z∈[-86,-30]mm · 4 of 82 slices shown]
[im 9/82  bone]
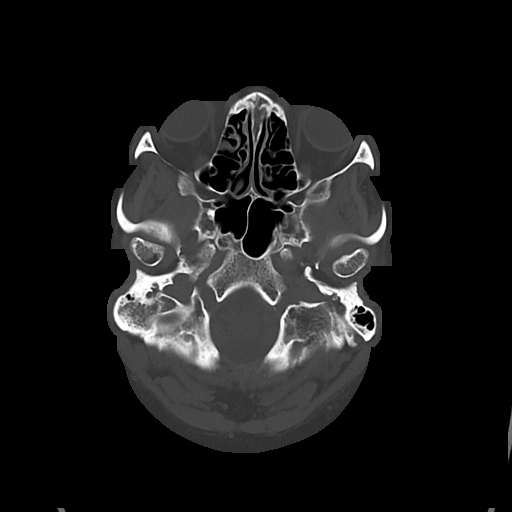
[im 17/82  bone]
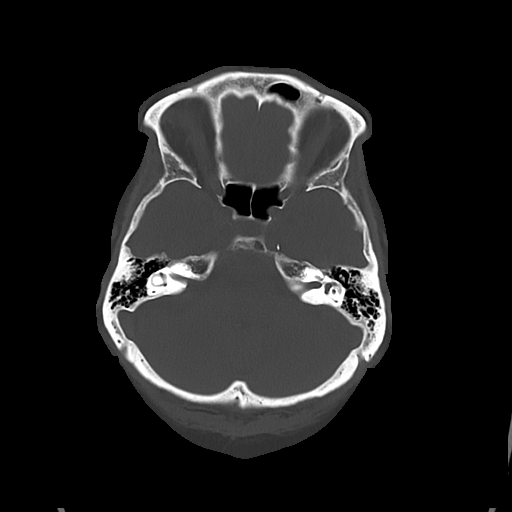
[im 25/82  bone]
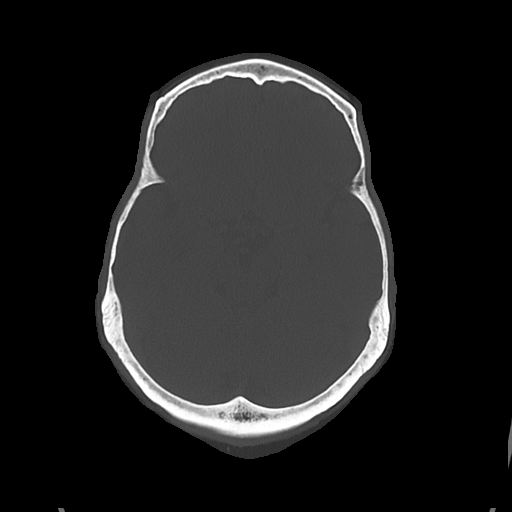
[im 37/82  bone]
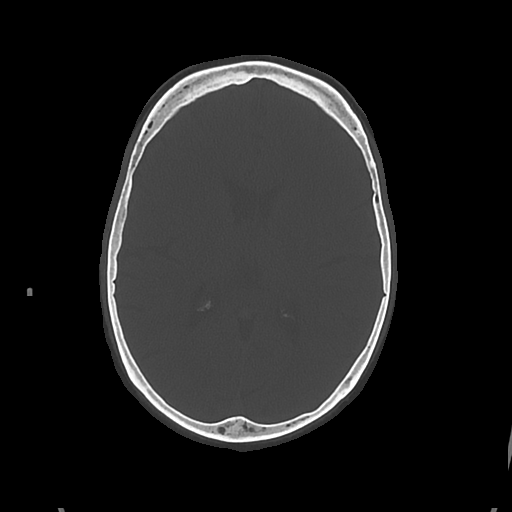

[Series 4: cor soft · coronal · 0.33mm/px · 3 of 66 slices shown]
[im 22/66  brain]
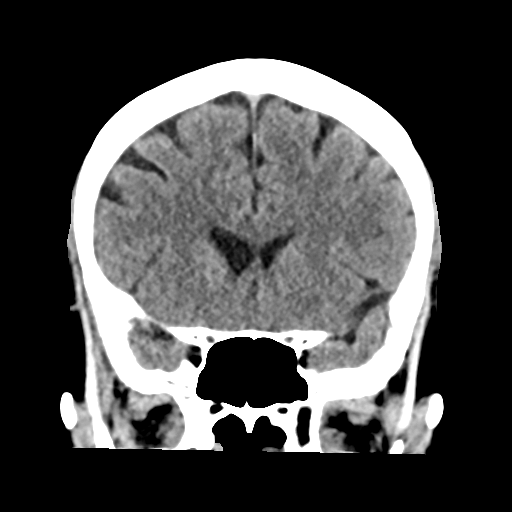
[im 29/66  brain]
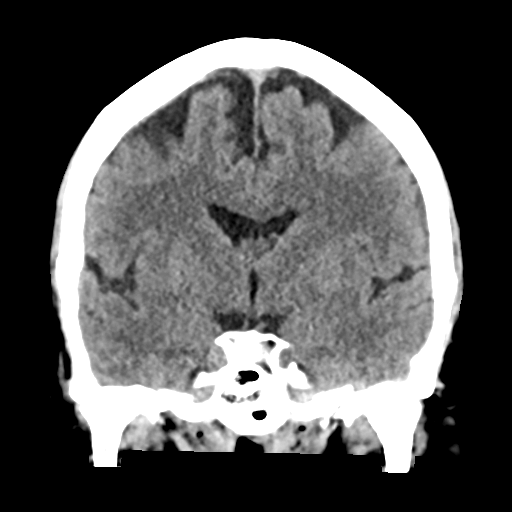
[im 37/66  brain]
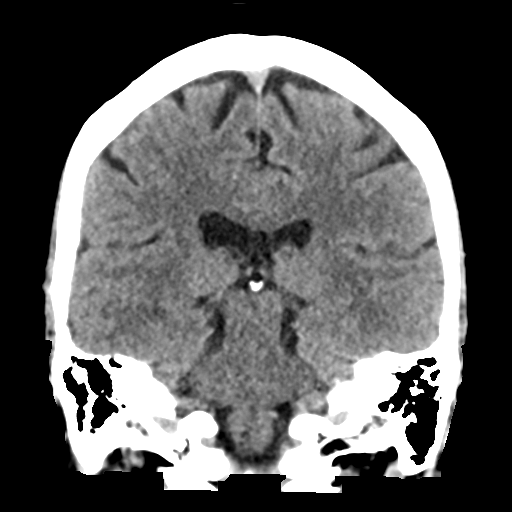

[Series 5: sag soft · sagittal · 0.33mm/px · 3 of 57 slices shown]
[im 19/57  brain]
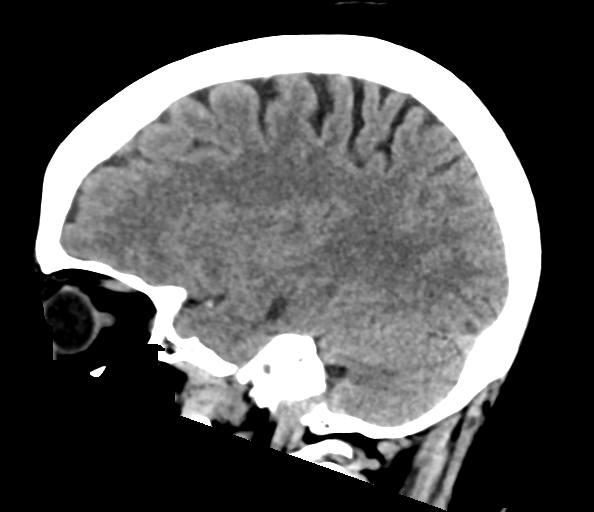
[im 29/57  brain]
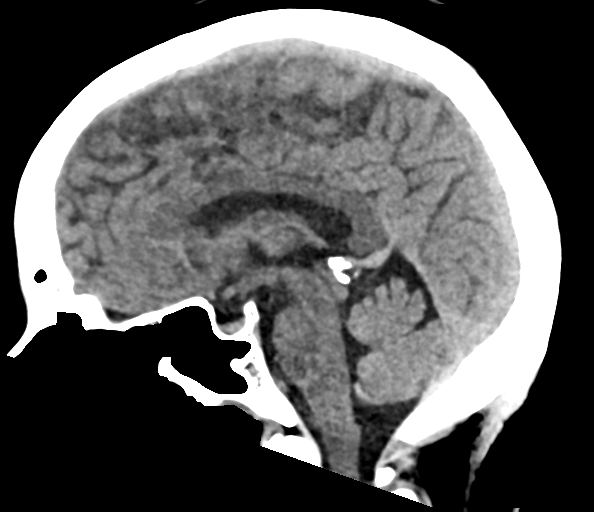
[im 38/57  brain]
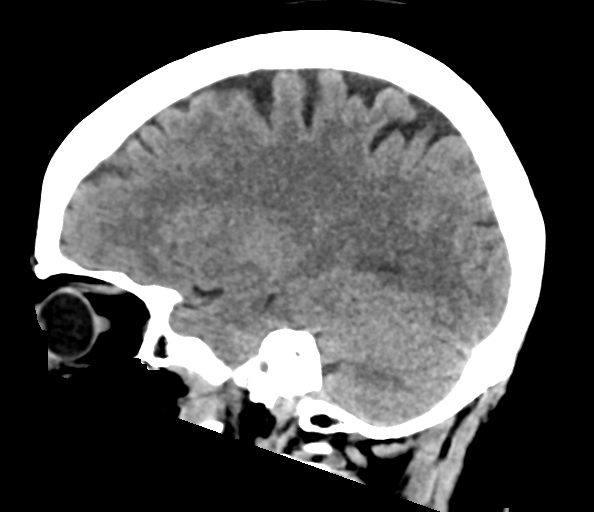

[17 of 47 positions shown; findings below may reference images not displayed]

FINDINGS: Brain: No acute infarct, hemorrhage, or mass lesion is present. No
significant white matter lesions are present. The ventricles are of
normal size. No significant extraaxial fluid collection is present.
The brainstem and cerebellum are within normal limits. Calcified
pineal gland is within normal limits.

Vascular: No hyperdense vessel or unexpected calcification.

Skull: Calvarium is intact. No focal lytic or blastic lesions are
present. No significant extracranial soft tissue lesion is present.

Sinuses/Orbits: The paranasal sinuses and mastoid air cells are
clear. The globes and orbits are within normal limits.
IMPRESSION: Negative CT of the head.

## 2023-01-20 ENCOUNTER — Encounter: Payer: Self-pay | Admitting: Surgery

## 2023-01-20 ENCOUNTER — Ambulatory Visit: Payer: Medicare Other | Admitting: Surgery

## 2023-01-20 VITALS — BP 122/77 | HR 64 | Temp 98.0°F | Ht 63.0 in | Wt 170.0 lb

## 2023-01-20 DIAGNOSIS — K449 Diaphragmatic hernia without obstruction or gangrene: Secondary | ICD-10-CM

## 2023-01-20 DIAGNOSIS — K219 Gastro-esophageal reflux disease without esophagitis: Secondary | ICD-10-CM | POA: Diagnosis not present
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
## 2023-01-20 NOTE — Patient Instructions (Addendum)
We need you to get your records of imaging studies. We need the images of these studies that were done.   Just drop off your CDs once you have them.   Call us when you would like to come back in to see Dr Everlene Farrier.

## 2023-01-27 ENCOUNTER — Ambulatory Visit: Payer: Medicare Other | Admitting: Internal Medicine

## 2023-01-28 ENCOUNTER — Ambulatory Visit: Payer: Medicare Other | Admitting: Internal Medicine

## 2023-02-04 ENCOUNTER — Ambulatory Visit: Payer: Medicare Other | Admitting: Internal Medicine

## 2023-02-04 ENCOUNTER — Encounter: Payer: Self-pay | Admitting: Podiatry

## 2023-02-04 ENCOUNTER — Ambulatory Visit (INDEPENDENT_AMBULATORY_CARE_PROVIDER_SITE_OTHER): Payer: Medicare Other

## 2023-02-04 ENCOUNTER — Encounter: Payer: Self-pay | Admitting: Internal Medicine

## 2023-02-04 ENCOUNTER — Ambulatory Visit: Payer: Medicare Other | Admitting: Podiatry

## 2023-02-04 VITALS — BP 120/75 | HR 90 | Temp 97.9°F | Resp 16 | Ht 63.0 in | Wt 170.6 lb

## 2023-02-04 VITALS — BP 125/71 | HR 65

## 2023-02-04 DIAGNOSIS — M19072 Primary osteoarthritis, left ankle and foot: Secondary | ICD-10-CM | POA: Diagnosis not present

## 2023-02-04 DIAGNOSIS — R0602 Shortness of breath: Secondary | ICD-10-CM | POA: Diagnosis not present

## 2023-02-04 DIAGNOSIS — M7752 Other enthesopathy of left foot: Secondary | ICD-10-CM | POA: Diagnosis not present

## 2023-02-04 DIAGNOSIS — J4520 Mild intermittent asthma, uncomplicated: Secondary | ICD-10-CM

## 2023-02-04 DIAGNOSIS — R053 Chronic cough: Secondary | ICD-10-CM | POA: Diagnosis not present

## 2023-02-04 DIAGNOSIS — K219 Gastro-esophageal reflux disease without esophagitis: Secondary | ICD-10-CM | POA: Diagnosis not present

## 2023-02-04 MED ORDER — BETAMETHASONE SOD PHOS & ACET 6 (3-3) MG/ML IJ SUSP
3.0000 mg | Freq: Once | INTRAMUSCULAR | Status: AC
  Administered 2023-02-04: 3 mg via INTRA_ARTICULAR

## 2023-02-04 MED ORDER — ALBUTEROL SULFATE (2.5 MG/3ML) 0.083% IN NEBU: 2.5000 mg | INHALATION_SOLUTION | Freq: Four times a day (QID) | RESPIRATORY_TRACT | 1 refills | Status: AC | PRN

## 2023-02-04 MED ORDER — ALBUTEROL SULFATE HFA 108 (90 BASE) MCG/ACT IN AERS: 2.0000 | INHALATION_SPRAY | Freq: Four times a day (QID) | RESPIRATORY_TRACT | 2 refills | Status: DC | PRN
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
## 2023-04-30 ENCOUNTER — Telehealth: Payer: Self-pay | Admitting: Internal Medicine

## 2023-04-30 NOTE — Telephone Encounter (Signed)
Left vm and sent mychart message to confirm 05/07/23 appointment-Toni

## 2023-05-07 ENCOUNTER — Encounter: Payer: Medicare Other | Admitting: Internal Medicine

## 2023-05-22 ENCOUNTER — Ambulatory Visit: Payer: Medicare Other | Admitting: Internal Medicine

## 2023-06-03 ENCOUNTER — Other Ambulatory Visit: Payer: Self-pay | Admitting: Infectious Diseases

## 2023-06-03 DIAGNOSIS — R600 Localized edema: Secondary | ICD-10-CM

## 2023-06-04 ENCOUNTER — Ambulatory Visit
Admission: RE | Admit: 2023-06-04 | Discharge: 2023-06-04 | Disposition: A | Discharge status: Home / Self Care | Payer: Medicare Other | Source: Ambulatory Visit | Attending: Infectious Diseases | Admitting: Infectious Diseases

## 2023-06-04 ENCOUNTER — Ambulatory Visit: Payer: Medicare Other | Admitting: Internal Medicine

## 2023-06-04 DIAGNOSIS — R600 Localized edema: Secondary | ICD-10-CM | POA: Insufficient documentation

## 2023-06-04 DIAGNOSIS — R0602 Shortness of breath: Secondary | ICD-10-CM | POA: Diagnosis not present

## 2023-06-16 ENCOUNTER — Ambulatory Visit: Payer: Medicare Other | Admitting: Internal Medicine

## 2023-06-19 ENCOUNTER — Ambulatory Visit: Payer: Medicare Other | Admitting: Physician Assistant

## 2023-06-20 ENCOUNTER — Ambulatory Visit (INDEPENDENT_AMBULATORY_CARE_PROVIDER_SITE_OTHER): Payer: Medicare Other | Admitting: Podiatry

## 2023-06-20 ENCOUNTER — Encounter: Payer: Self-pay | Admitting: Podiatry

## 2023-06-20 VITALS — BP 110/80 | HR 62 | Temp 98.6°F | Ht 63.0 in | Wt 153.0 lb

## 2023-06-20 DIAGNOSIS — L309 Dermatitis, unspecified: Secondary | ICD-10-CM

## 2023-06-20 MED ORDER — CLOTRIMAZOLE-BETAMETHASONE 1-0.05 % EX CREA: 1.0000 | TOPICAL_CREAM | Freq: Every day | CUTANEOUS | 1 refills | Status: DC
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
## 2023-06-26 ENCOUNTER — Encounter: Payer: Medicare Other | Attending: Infectious Diseases | Admitting: Dietician

## 2023-06-26 ENCOUNTER — Encounter: Payer: Self-pay | Admitting: Dietician

## 2023-06-26 VITALS — Ht 63.0 in | Wt 153.0 lb

## 2023-06-26 DIAGNOSIS — J45901 Unspecified asthma with (acute) exacerbation: Secondary | ICD-10-CM | POA: Diagnosis present

## 2023-06-26 DIAGNOSIS — E7801 Familial hypercholesterolemia: Secondary | ICD-10-CM | POA: Diagnosis not present

## 2023-06-26 DIAGNOSIS — K2270 Barrett's esophagus without dysplasia: Secondary | ICD-10-CM | POA: Diagnosis not present

## 2023-06-26 DIAGNOSIS — Z9889 Other specified postprocedural states: Secondary | ICD-10-CM | POA: Insufficient documentation

## 2023-06-26 DIAGNOSIS — R7303 Prediabetes: Secondary | ICD-10-CM | POA: Insufficient documentation

## 2023-06-26 DIAGNOSIS — E782 Mixed hyperlipidemia: Secondary | ICD-10-CM
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
## 2023-06-26 NOTE — Patient Instructions (Signed)
Weigh yourself NO MORE than once a week!!!  Call your PCP to ask for a refill of your Atorvastatin. When restarting atorvastatin, discontinue taking red yeast rice.  Consider plant sterols (CholestOff) in place of the Chlorella for reduction of cholesterol!  Use a combination of these three strategies to effectively lower your consumption of fats 1) Consume a smaller portion when having fatty foods 2) Consume higher fat foods less frequently 3) Find a reduced or low fat version of that food  Work towards eating three meals a day, about 5-6 hours apart!  Begin to recognize carbohydrates, proteins, and non-starchy vegetables in your food choices!  Begin to build your meals using the proportions of the Balanced Plate. First, select your carb choice(s) for the meal. Make this 25% of your meal. Next, select your source of protein to pair with your carb choice(s). Make this another 25% of your meal. Finally, complete your meal with a variety of non-starchy vegetables. Make this the remaining 50% of your meal.

## 2023-06-30 ENCOUNTER — Ambulatory Visit: Payer: Medicare Other | Admitting: Internal Medicine

## 2023-06-30 ENCOUNTER — Encounter: Payer: Self-pay | Admitting: Internal Medicine

## 2023-06-30 VITALS — BP 120/76 | HR 60 | Temp 98.2°F | Resp 16 | Ht 63.0 in | Wt 156.6 lb

## 2023-06-30 DIAGNOSIS — Z23 Encounter for immunization: Secondary | ICD-10-CM

## 2023-06-30 DIAGNOSIS — J4520 Mild intermittent asthma, uncomplicated: Secondary | ICD-10-CM

## 2023-06-30 DIAGNOSIS — K219 Gastro-esophageal reflux disease without esophagitis: Secondary | ICD-10-CM

## 2023-06-30 MED ORDER — BUDESONIDE-FORMOTEROL FUMARATE 80-4.5 MCG/ACT IN AERO: 2.0000 | INHALATION_SPRAY | Freq: Two times a day (BID) | RESPIRATORY_TRACT | 3 refills | Status: DC

## 2023-06-30 MED ORDER — ALBUTEROL SULFATE HFA 108 (90 BASE) MCG/ACT IN AERS: 2.0000 | INHALATION_SPRAY | Freq: Four times a day (QID) | RESPIRATORY_TRACT | 2 refills | Status: DC | PRN
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
## 2023-06-30 NOTE — Patient Instructions (Signed)
Asthma, Adult  Asthma is a condition that causes swelling and narrowing of the airways. These are the passages that lead from the nose and mouth down into the lungs. When asthma symptoms get worse it is called an asthma attack or flare. This can make it hard to breathe. Asthma flares can range from minor to life-threatening. There is no cure for asthma, but medicines and lifestyle changes can help to control it. What are the causes? It is not known exactly what causes asthma, but certain things can cause asthma symptoms to get worse (triggers). What can trigger an asthma attack? Cigarette smoke. Mold. Dust. Your pet's skin flakes (dander). Cockroaches. Pollen. Air pollution (like household cleaners, wood smoke, smog, or Therapist, occupational). What are the signs or symptoms? Trouble breathing (shortness of breath). Coughing. Making high-pitched whistling sounds when you breathe, most often when you breathe out (wheezing). Chest tightness. Tiredness with little activity. Poor exercise tolerance. How is this treated? Controller medicines that help prevent asthma symptoms. Fast-acting reliever or rescue medicines. These give short-term relief of asthma symptoms. Allergy medicines if your attacks are brought on by allergens. Medicines to help control the body's defense (immune) system. Staying away from the things that cause asthma attacks. Follow these instructions at home: Avoiding triggers in your home Do not allow anyone to smoke in your home. Limit use of fireplaces and wood stoves. Get rid of pests (such as roaches and mice) and their droppings. Keep your home clean. Clean your floors. Dust regularly. Use cleaning products that do not smell. Wash bed sheets and blankets every week in hot water. Dry them in a dryer. Have someone vacuum when you are not home. Change your heating and air conditioning filters often. Use blankets that are made of polyester or cotton. General  instructions Take over-the-counter and prescription medicines only as told by your doctor. Do not smoke or use any products that contain nicotine or tobacco. If you need help quitting, ask your doctor. Stay away from secondhand smoke. Avoid doing things outdoors when allergen counts are high and when air quality is low. Warm up before you exercise. Take time to cool down after exercise. Use a peak flow meter as told by your doctor. A peak flow meter is a tool that measures how well your lungs are working. Keep track of the peak flow meter's readings. Write them down. Follow your asthma action plan. This is a written plan for taking care of your asthma and treating your attacks. Make sure you get all the shots (vaccines) that your doctor recommends. Ask your doctor about a flu shot and a pneumonia shot. Keep all follow-up visits. Contact a doctor if: You have wheezing, shortness of breath, or a cough even while taking medicine to prevent attacks. The mucus you cough up (sputum) is thicker than usual. The mucus you cough up changes from clear or white to yellow, green, gray, or is bloody. You have problems from the medicine you are taking, such as: A rash. Itching. Swelling. Trouble breathing. You need reliever medicines more than 2-3 times a week. Your peak flow reading is still at 50-79% of your personal best after following the action plan for 1 hour. You have a fever. Get help right away if: You seem to be worse and are not responding to medicine during an asthma attack. You are short of breath even at rest. You get short of breath when doing very little activity. You have trouble eating, drinking, or talking. You have chest  pain or tightness. You have a fast heartbeat. Your lips or fingernails start to turn blue. You are light-headed or dizzy, or you faint. Your peak flow is less than 50% of your personal best. You feel too tired to breathe normally. These symptoms may be an  emergency. Get help right away. Call 911. Do not wait to see if the symptoms will go away. Do not drive yourself to the hospital. Summary Asthma is a long-term (chronic) condition in which the airways get tight and narrow. An asthma attack can make it hard to breathe. Asthma cannot be cured, but medicines and lifestyle changes can help control it. Make sure you understand how to avoid triggers and how and when to use your medicines. Avoid things that can cause allergy symptoms (allergens). These include animal skin flakes (dander) and pollen from trees or grass. Avoid things that pollute the air. These may include household cleaners, wood smoke, smog, or chemical odors. This information is not intended to replace advice given to you by your health care provider. Make sure you discuss any questions you have with your health care provider. Document Revised: 06/11/2021 Document Reviewed: 06/11/2021 Elsevier Patient Education  2024 [employer].

## 2023-07-01 ENCOUNTER — Encounter (INDEPENDENT_AMBULATORY_CARE_PROVIDER_SITE_OTHER): Payer: Self-pay | Admitting: Vascular Surgery

## 2023-07-01 ENCOUNTER — Ambulatory Visit (INDEPENDENT_AMBULATORY_CARE_PROVIDER_SITE_OTHER): Payer: Medicare Other | Admitting: Vascular Surgery

## 2023-07-01 VITALS — BP 115/78 | HR 63 | Resp 18 | Ht 63.0 in | Wt 153.4 lb

## 2023-07-01 DIAGNOSIS — I10 Essential (primary) hypertension: Secondary | ICD-10-CM | POA: Diagnosis not present

## 2023-07-01 DIAGNOSIS — M7989 Other specified soft tissue disorders: Secondary | ICD-10-CM
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
## 2023-07-01 NOTE — Assessment & Plan Note (Signed)
blood pressure control important in reducing the progression of atherosclerotic disease. On appropriate oral medications.  

## 2023-07-04 ENCOUNTER — Ambulatory Visit (INDEPENDENT_AMBULATORY_CARE_PROVIDER_SITE_OTHER): Payer: Medicare Other | Admitting: Podiatry

## 2023-07-04 DIAGNOSIS — L309 Dermatitis, unspecified: Secondary | ICD-10-CM | POA: Diagnosis not present
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
## 2023-07-13 NOTE — Procedures (Signed)
[facility] [address] [place], [address]    Complete Pulmonary Function Testing Interpretation:  FINDINGS:  Forced vital capacity is normal.  FEV1 is 1.42 L which is 70% of predicted and is mildly decreased.  FEV1 FVC ratio is mildly decreased.  Postbronchodilator there is no significant change in FEV1.  Total lung capacity is normal.  Residual volume is increased.  Residual volume total capacity ratio is increased FRC is increased.  DLCO is normal.  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  Yevonne Pax, MD [facility] Pulmonary Critical Care Medicine Sleep Medicine

## 2023-08-01 LAB — PULMONARY FUNCTION TEST

## 2023-08-20 ENCOUNTER — Encounter (INDEPENDENT_AMBULATORY_CARE_PROVIDER_SITE_OTHER): Payer: Self-pay | Admitting: Nurse Practitioner

## 2023-08-20 ENCOUNTER — Ambulatory Visit (INDEPENDENT_AMBULATORY_CARE_PROVIDER_SITE_OTHER): Payer: Medicare Other | Admitting: Nurse Practitioner

## 2023-08-20 ENCOUNTER — Ambulatory Visit (INDEPENDENT_AMBULATORY_CARE_PROVIDER_SITE_OTHER): Payer: Medicare Other

## 2023-08-20 VITALS — BP 130/83 | HR 65 | Resp 18 | Ht 62.0 in | Wt 155.6 lb

## 2023-08-20 DIAGNOSIS — I890 Lymphedema, not elsewhere classified: Secondary | ICD-10-CM

## 2023-08-20 DIAGNOSIS — M7989 Other specified soft tissue disorders: Secondary | ICD-10-CM

## 2023-08-20 DIAGNOSIS — L2089 Other atopic dermatitis: Secondary | ICD-10-CM

## 2023-08-20 DIAGNOSIS — I10 Essential (primary) hypertension: Secondary | ICD-10-CM | POA: Diagnosis not present
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
## 2023-09-02 ENCOUNTER — Encounter: Payer: Self-pay | Admitting: Podiatry

## 2023-09-02 ENCOUNTER — Ambulatory Visit (INDEPENDENT_AMBULATORY_CARE_PROVIDER_SITE_OTHER): Payer: Medicare Other | Admitting: Podiatry

## 2023-09-02 DIAGNOSIS — M19072 Primary osteoarthritis, left ankle and foot: Secondary | ICD-10-CM | POA: Diagnosis not present

## 2023-09-02 MED ORDER — BETAMETHASONE SOD PHOS & ACET 6 (3-3) MG/ML IJ SUSP
3.0000 mg | Freq: Once | INTRAMUSCULAR | Status: AC
  Administered 2023-09-02: 3 mg via INTRA_ARTICULAR
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
## 2023-12-19 ENCOUNTER — Encounter: Payer: Self-pay | Admitting: Podiatry

## 2023-12-19 ENCOUNTER — Ambulatory Visit: Admitting: Podiatry

## 2023-12-19 VITALS — Ht 62.0 in | Wt 156.0 lb

## 2023-12-19 DIAGNOSIS — M19072 Primary osteoarthritis, left ankle and foot: Secondary | ICD-10-CM

## 2023-12-19 DIAGNOSIS — M79675 Pain in left toe(s): Secondary | ICD-10-CM

## 2023-12-19 DIAGNOSIS — M79674 Pain in right toe(s): Secondary | ICD-10-CM | POA: Diagnosis not present

## 2023-12-19 DIAGNOSIS — B351 Tinea unguium: Secondary | ICD-10-CM

## 2023-12-19 DIAGNOSIS — Z79899 Other long term (current) drug therapy: Secondary | ICD-10-CM

## 2023-12-19 MED ORDER — TERBINAFINE HCL 250 MG PO TABS: 250.0000 mg | ORAL_TABLET | Freq: Every day | ORAL | 0 refills | Status: AC
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
## 2023-12-20 LAB — HEPATIC FUNCTION PANEL
ALT: 19 IU/L (ref 0–32)
AST: 26 IU/L (ref 0–40)
Albumin: 4.1 g/dL (ref 3.9–4.9)
Alkaline Phosphatase: 73 IU/L (ref 44–121)
Bilirubin Total: 0.6 mg/dL (ref 0.0–1.2)
Bilirubin, Direct: 0.2 mg/dL (ref 0.00–0.40)
Total Protein: 6.2 g/dL (ref 6.0–8.5)

## 2023-12-29 ENCOUNTER — Ambulatory Visit: Payer: Medicare Other | Admitting: Internal Medicine

## 2024-01-06 DIAGNOSIS — M19072 Primary osteoarthritis, left ankle and foot: Secondary | ICD-10-CM | POA: Diagnosis not present

## 2024-01-06 MED ORDER — BETAMETHASONE SOD PHOS & ACET 6 (3-3) MG/ML IJ SUSP
3.0000 mg | Freq: Once | INTRAMUSCULAR | Status: AC
  Administered 2024-01-06: 3 mg via INTRA_ARTICULAR

## 2024-01-12 ENCOUNTER — Ambulatory Visit: Admitting: Internal Medicine

## 2024-02-03 ENCOUNTER — Encounter (INDEPENDENT_AMBULATORY_CARE_PROVIDER_SITE_OTHER): Payer: Self-pay

## 2024-02-17 ENCOUNTER — Ambulatory Visit (INDEPENDENT_AMBULATORY_CARE_PROVIDER_SITE_OTHER): Payer: Medicare Other | Admitting: Nurse Practitioner

## 2024-02-18 ENCOUNTER — Ambulatory Visit (INDEPENDENT_AMBULATORY_CARE_PROVIDER_SITE_OTHER): Payer: Medicare Other | Admitting: Nurse Practitioner

## 2024-04-09 ENCOUNTER — Other Ambulatory Visit: Payer: Self-pay | Admitting: Family Medicine

## 2024-04-09 DIAGNOSIS — M5416 Radiculopathy, lumbar region: Secondary | ICD-10-CM

## 2024-04-09 DIAGNOSIS — M5412 Radiculopathy, cervical region: Secondary | ICD-10-CM

## 2024-04-14 ENCOUNTER — Other Ambulatory Visit

## 2024-04-30 ENCOUNTER — Inpatient Hospital Stay: Admission: RE | Admit: 2024-04-30 | Source: Ambulatory Visit

## 2024-05-24 ENCOUNTER — Ambulatory Visit: Admitting: Internal Medicine

## 2024-05-24 ENCOUNTER — Encounter: Payer: Self-pay | Admitting: Internal Medicine

## 2024-05-24 VITALS — BP 125/80 | HR 62 | Temp 98.0°F | Resp 16 | Ht 62.0 in | Wt 168.0 lb

## 2024-05-24 DIAGNOSIS — K219 Gastro-esophageal reflux disease without esophagitis: Secondary | ICD-10-CM | POA: Diagnosis not present

## 2024-05-24 DIAGNOSIS — J4520 Mild intermittent asthma, uncomplicated: Secondary | ICD-10-CM

## 2024-05-24 MED ORDER — AZITHROMYCIN 250 MG PO TABS: ORAL_TABLET | ORAL | 0 refills | Status: AC

## 2024-05-24 MED ORDER — PREDNISONE 10 MG (21) PO TBPK: ORAL_TABLET | ORAL | 0 refills | Status: DC

## 2024-05-24 NOTE — Patient Instructions (Signed)
 Asthma, Adult  Asthma is a condition that causes swelling and narrowing of the airways. These are the passages that lead from the nose and mouth down into the lungs. When asthma symptoms get worse it is called an asthma attack or flare. This can make it hard to breathe. Asthma flares can range from minor to life-threatening. There is no cure for asthma, but medicines and lifestyle changes can help to control it. What are the causes? It is not known exactly what causes asthma, but certain things can cause asthma symptoms to get worse (triggers). What can trigger an asthma attack? Cigarette smoke. Mold. Dust. Your pet's skin flakes (dander). Cockroaches. Pollen. Air pollution (like household cleaners, wood smoke, smog, or Therapist, occupational). What are the signs or symptoms? Trouble breathing (shortness of breath). Coughing. Making high-pitched whistling sounds when you breathe, most often when you breathe out (wheezing). Chest tightness. Tiredness with little activity. Poor exercise tolerance. How is this treated? Controller medicines that help prevent asthma symptoms. Fast-acting reliever or rescue medicines. These give short-term relief of asthma symptoms. Allergy medicines if your attacks are brought on by allergens. Medicines to help control the body's defense (immune) system. Staying away from the things that cause asthma attacks. Follow these instructions at home: Avoiding triggers in your home Do not allow anyone to smoke in your home. Limit use of fireplaces and wood stoves. Get rid of pests (such as roaches and mice) and their droppings. Keep your home clean. Clean your floors. Dust regularly. Use cleaning products that do not smell. Wash bed sheets and blankets every week in hot water. Dry them in a dryer. Have someone vacuum when you are not home. Change your heating and air conditioning filters often. Use blankets that are made of polyester or cotton. General  instructions Take over-the-counter and prescription medicines only as told by your doctor. Do not smoke or use any products that contain nicotine or tobacco. If you need help quitting, ask your doctor. Stay away from secondhand smoke. Avoid doing things outdoors when allergen counts are high and when air quality is low. Warm up before you exercise. Take time to cool down after exercise. Use a peak flow meter as told by your doctor. A peak flow meter is a tool that measures how well your lungs are working. Keep track of the peak flow meter's readings. Write them down. Follow your asthma action plan. This is a written plan for taking care of your asthma and treating your attacks. Make sure you get all the shots (vaccines) that your doctor recommends. Ask your doctor about a flu shot and a pneumonia shot. Keep all follow-up visits. Contact a doctor if: You have wheezing, shortness of breath, or a cough even while taking medicine to prevent attacks. The mucus you cough up (sputum) is thicker than usual. The mucus you cough up changes from clear or white to yellow, green, gray, or is bloody. You have problems from the medicine you are taking, such as: A rash. Itching. Swelling. Trouble breathing. You need reliever medicines more than 2-3 times a week. Your peak flow reading is still at 50-79% of your personal best after following the action plan for 1 hour. You have a fever. Get help right away if: You seem to be worse and are not responding to medicine during an asthma attack. You are short of breath even at rest. You get short of breath when doing very little activity. You have trouble eating, drinking, or talking. You have chest  pain or tightness. You have a fast heartbeat. Your lips or fingernails start to turn blue. You are light-headed or dizzy, or you faint. Your peak flow is less than 50% of your personal best. You feel too tired to breathe normally. These symptoms may be an  emergency. Get help right away. Call 911. Do not wait to see if the symptoms will go away. Do not drive yourself to the hospital. Summary Asthma is a long-term (chronic) condition in which the airways get tight and narrow. An asthma attack can make it hard to breathe. Asthma cannot be cured, but medicines and lifestyle changes can help control it. Make sure you understand how to avoid triggers and how and when to use your medicines. Avoid things that can cause allergy symptoms (allergens). These include animal skin flakes (dander) and pollen from trees or grass. Avoid things that pollute the air. These may include household cleaners, wood smoke, smog, or chemical odors. This information is not intended to replace advice given to you by your health care provider. Make sure you discuss any questions you have with your health care provider. Document Revised: 06/11/2021 Document Reviewed: 06/11/2021 Elsevier Patient Education  2024 [employer].
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
## 2024-06-09 ENCOUNTER — Telehealth: Payer: Self-pay | Admitting: Internal Medicine

## 2024-06-09 ENCOUNTER — Other Ambulatory Visit: Payer: Self-pay

## 2024-06-09 ENCOUNTER — Other Ambulatory Visit

## 2024-06-09 DIAGNOSIS — R0602 Shortness of breath: Secondary | ICD-10-CM

## 2024-06-09 NOTE — Telephone Encounter (Signed)
 Left vm and sent mychart message to confirm 06/16/24 appointment-Krista Robinson

## 2024-06-15 ENCOUNTER — Inpatient Hospital Stay: Admission: RE | Admit: 2024-06-15 | Source: Ambulatory Visit

## 2024-06-15 ENCOUNTER — Other Ambulatory Visit

## 2024-06-16 ENCOUNTER — Ambulatory Visit: Payer: Medicare Other | Admitting: Internal Medicine

## 2024-06-16 DIAGNOSIS — R0602 Shortness of breath: Secondary | ICD-10-CM

## 2024-06-28 ENCOUNTER — Ambulatory Visit: Admitting: Internal Medicine

## 2024-06-28 ENCOUNTER — Inpatient Hospital Stay
Admission: RE | Admit: 2024-06-28 | Discharge: 2024-06-28 | Disposition: A | Source: Ambulatory Visit | Attending: Family Medicine | Admitting: Family Medicine

## 2024-06-28 ENCOUNTER — Ambulatory Visit
Admission: RE | Admit: 2024-06-28 | Discharge: 2024-06-28 | Disposition: A | Discharge status: Home / Self Care | Source: Ambulatory Visit | Attending: Family Medicine | Admitting: Family Medicine

## 2024-06-28 DIAGNOSIS — Z91018 Allergy to other foods: Secondary | ICD-10-CM | POA: Insufficient documentation

## 2024-06-28 DIAGNOSIS — M5416 Radiculopathy, lumbar region: Secondary | ICD-10-CM

## 2024-06-28 DIAGNOSIS — J4550 Severe persistent asthma, uncomplicated: Secondary | ICD-10-CM | POA: Insufficient documentation

## 2024-06-28 DIAGNOSIS — M5412 Radiculopathy, cervical region: Secondary | ICD-10-CM

## 2024-06-29 ENCOUNTER — Ambulatory Visit: Admitting: Internal Medicine

## 2024-06-29 ENCOUNTER — Encounter: Payer: Self-pay | Admitting: Internal Medicine

## 2024-06-29 VITALS — BP 135/80 | HR 80 | Temp 98.0°F | Resp 16 | Ht 62.0 in | Wt 170.0 lb

## 2024-06-29 DIAGNOSIS — K219 Gastro-esophageal reflux disease without esophagitis: Secondary | ICD-10-CM

## 2024-06-29 DIAGNOSIS — Z23 Encounter for immunization: Secondary | ICD-10-CM | POA: Diagnosis not present

## 2024-06-29 DIAGNOSIS — J4520 Mild intermittent asthma, uncomplicated: Secondary | ICD-10-CM | POA: Diagnosis not present

## 2024-06-29 MED ORDER — BUDESONIDE-FORMOTEROL FUMARATE 80-4.5 MCG/ACT IN AERO: 2.0000 | INHALATION_SPRAY | Freq: Two times a day (BID) | RESPIRATORY_TRACT | 3 refills | Status: AC

## 2024-06-29 NOTE — Patient Instructions (Signed)
 Asthma, Adult  Asthma is a condition that causes swelling and narrowing of the airways. These are the passages that lead from the nose and mouth down into the lungs. When asthma symptoms get worse it is called an asthma attack or flare. This can make it hard to breathe. Asthma flares can range from minor to life-threatening. There is no cure for asthma, but medicines and lifestyle changes can help to control it. What are the causes? It is not known exactly what causes asthma, but certain things can cause asthma symptoms to get worse (triggers). What can trigger an asthma attack? Cigarette smoke. Mold. Dust. Your pet's skin flakes (dander). Cockroaches. Pollen. Air pollution (like household cleaners, wood smoke, smog, or Therapist, occupational). What are the signs or symptoms? Trouble breathing (shortness of breath). Coughing. Making high-pitched whistling sounds when you breathe, most often when you breathe out (wheezing). Chest tightness. Tiredness with little activity. Poor exercise tolerance. How is this treated? Controller medicines that help prevent asthma symptoms. Fast-acting reliever or rescue medicines. These give short-term relief of asthma symptoms. Allergy medicines if your attacks are brought on by allergens. Medicines to help control the body's defense (immune) system. Staying away from the things that cause asthma attacks. Follow these instructions at home: Avoiding triggers in your home Do not allow anyone to smoke in your home. Limit use of fireplaces and wood stoves. Get rid of pests (such as roaches and mice) and their droppings. Keep your home clean. Clean your floors. Dust regularly. Use cleaning products that do not smell. Wash bed sheets and blankets every week in hot water. Dry them in a dryer. Have someone vacuum when you are not home. Change your heating and air conditioning filters often. Use blankets that are made of polyester or cotton. General  instructions Take over-the-counter and prescription medicines only as told by your doctor. Do not smoke or use any products that contain nicotine or tobacco. If you need help quitting, ask your doctor. Stay away from secondhand smoke. Avoid doing things outdoors when allergen counts are high and when air quality is low. Warm up before you exercise. Take time to cool down after exercise. Use a peak flow meter as told by your doctor. A peak flow meter is a tool that measures how well your lungs are working. Keep track of the peak flow meter's readings. Write them down. Follow your asthma action plan. This is a written plan for taking care of your asthma and treating your attacks. Make sure you get all the shots (vaccines) that your doctor recommends. Ask your doctor about a flu shot and a pneumonia shot. Keep all follow-up visits. Contact a doctor if: You have wheezing, shortness of breath, or a cough even while taking medicine to prevent attacks. The mucus you cough up (sputum) is thicker than usual. The mucus you cough up changes from clear or white to yellow, green, gray, or is bloody. You have problems from the medicine you are taking, such as: A rash. Itching. Swelling. Trouble breathing. You need reliever medicines more than 2-3 times a week. Your peak flow reading is still at 50-79% of your personal best after following the action plan for 1 hour. You have a fever. Get help right away if: You seem to be worse and are not responding to medicine during an asthma attack. You are short of breath even at rest. You get short of breath when doing very little activity. You have trouble eating, drinking, or talking. You have chest  pain or tightness. You have a fast heartbeat. Your lips or fingernails start to turn blue. You are light-headed or dizzy, or you faint. Your peak flow is less than 50% of your personal best. You feel too tired to breathe normally. These symptoms may be an  emergency. Get help right away. Call 911. Do not wait to see if the symptoms will go away. Do not drive yourself to the hospital. Summary Asthma is a long-term (chronic) condition in which the airways get tight and narrow. An asthma attack can make it hard to breathe. Asthma cannot be cured, but medicines and lifestyle changes can help control it. Make sure you understand how to avoid triggers and how and when to use your medicines. Avoid things that can cause allergy symptoms (allergens). These include animal skin flakes (dander) and pollen from trees or grass. Avoid things that pollute the air. These may include household cleaners, wood smoke, smog, or chemical odors. This information is not intended to replace advice given to you by your health care provider. Make sure you discuss any questions you have with your health care provider. Document Revised: 06/11/2021 Document Reviewed: 06/11/2021 Elsevier Patient Education  2024 [employer].

## 2024-06-29 NOTE — Addendum Note (Signed)
 Addended by: ALMER BI on: 06/29/2024 03:13 PM   Modules accepted: Orders
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
## 2024-06-29 NOTE — Addendum Note (Signed)
 Addended by: Giovannie Scerbo A on: 06/29/2024 03:01 PM   Modules accepted: Orders

## 2024-06-30 LAB — PULMONARY FUNCTION TEST

## 2024-07-05 NOTE — Procedures (Signed)
 [facility] [address] [place], [address]    Complete Pulmonary Function Testing Interpretation:  FINDINGS:  The forced vital capacity is normal.  FEV1 is 1.48 L which is 79% of predicted and is mildly decreased.  FEV1 FVC ratio is decreased.  Postbronchodilator there is no significant change in the FEV1.  Total lung capacity is normal.  Residual volume is increased.  FRC is increased.  The DLCO was normal.  IMPRESSION:  This study is consistent with mild obstructive lung disease clinical correlation is recommended.  Krista DELENA Bathe, MD [facility] Pulmonary Critical Care Medicine Sleep Medicine

## 2024-07-06 ENCOUNTER — Encounter: Payer: Self-pay | Admitting: Podiatry

## 2024-07-06 ENCOUNTER — Ambulatory Visit: Admitting: Podiatry

## 2024-07-06 VITALS — Ht 62.0 in | Wt 170.0 lb

## 2024-07-06 DIAGNOSIS — B351 Tinea unguium: Secondary | ICD-10-CM

## 2024-07-06 DIAGNOSIS — M19072 Primary osteoarthritis, left ankle and foot: Secondary | ICD-10-CM

## 2024-07-06 MED ORDER — BETAMETHASONE SOD PHOS & ACET 6 (3-3) MG/ML IJ SUSP
3.0000 mg | Freq: Once | INTRAMUSCULAR | Status: AC
  Administered 2024-07-06: 3 mg via INTRA_ARTICULAR
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
## 2024-07-07 LAB — PULMONARY FUNCTION TEST

## 2024-09-21 DIAGNOSIS — J4520 Mild intermittent asthma, uncomplicated: Secondary | ICD-10-CM

## 2024-09-21 MED ORDER — ALBUTEROL SULFATE HFA 108 (90 BASE) MCG/ACT IN AERS: 2.0000 | INHALATION_SPRAY | Freq: Four times a day (QID) | RESPIRATORY_TRACT | 2 refills | Status: AC | PRN

## 2024-09-28 ENCOUNTER — Encounter: Payer: Self-pay | Admitting: Internal Medicine

## 2024-09-28 ENCOUNTER — Ambulatory Visit: Admitting: Internal Medicine

## 2024-09-28 VITALS — BP 128/73 | HR 65 | Temp 98.0°F | Resp 16 | Ht 62.0 in | Wt 166.2 lb

## 2024-09-28 DIAGNOSIS — R0602 Shortness of breath: Secondary | ICD-10-CM

## 2024-09-28 DIAGNOSIS — R0789 Other chest pain: Secondary | ICD-10-CM

## 2024-09-28 DIAGNOSIS — I340 Nonrheumatic mitral (valve) insufficiency: Secondary | ICD-10-CM

## 2024-09-28 DIAGNOSIS — K219 Gastro-esophageal reflux disease without esophagitis: Secondary | ICD-10-CM

## 2024-09-28 DIAGNOSIS — J4520 Mild intermittent asthma, uncomplicated: Secondary | ICD-10-CM

## 2024-09-28 MED ORDER — LEVOFLOXACIN 750 MG PO TABS: 750.0000 mg | ORAL_TABLET | Freq: Every day | ORAL | 0 refills | Status: AC

## 2024-09-28 NOTE — Patient Instructions (Signed)
 Asthma, Adult  Asthma is a condition that causes swelling and narrowing of the airways. These are the passages that lead from the nose and mouth down into the lungs. When asthma symptoms get worse it is called an asthma attack or flare. This can make it hard to breathe. Asthma flares can range from minor to life-threatening. There is no cure for asthma, but medicines and lifestyle changes can help to control it. What are the causes? It is not known exactly what causes asthma, but certain things can cause asthma symptoms to get worse (triggers). What can trigger an asthma attack? Cigarette smoke. Mold. Dust. Your pet's skin flakes (dander). Cockroaches. Pollen. Air pollution (like household cleaners, wood smoke, smog, or Therapist, occupational). What are the signs or symptoms? Trouble breathing (shortness of breath). Coughing. Making high-pitched whistling sounds when you breathe, most often when you breathe out (wheezing). Chest tightness. Tiredness with little activity. Poor exercise tolerance. How is this treated? Controller medicines that help prevent asthma symptoms. Fast-acting reliever or rescue medicines. These give short-term relief of asthma symptoms. Allergy medicines if your attacks are brought on by allergens. Medicines to help control the body's defense (immune) system. Staying away from the things that cause asthma attacks. Follow these instructions at home: Avoiding triggers in your home Do not allow anyone to smoke in your home. Limit use of fireplaces and wood stoves. Get rid of pests (such as roaches and mice) and their droppings. Keep your home clean. Clean your floors. Dust regularly. Use cleaning products that do not smell. Wash bed sheets and blankets every week in hot water. Dry them in a dryer. Have someone vacuum when you are not home. Change your heating and air conditioning filters often. Use blankets that are made of polyester or cotton. General  instructions Take over-the-counter and prescription medicines only as told by your doctor. Do not smoke or use any products that contain nicotine or tobacco. If you need help quitting, ask your doctor. Stay away from secondhand smoke. Avoid doing things outdoors when allergen counts are high and when air quality is low. Warm up before you exercise. Take time to cool down after exercise. Use a peak flow meter as told by your doctor. A peak flow meter is a tool that measures how well your lungs are working. Keep track of the peak flow meter's readings. Write them down. Follow your asthma action plan. This is a written plan for taking care of your asthma and treating your attacks. Make sure you get all the shots (vaccines) that your doctor recommends. Ask your doctor about a flu shot and a pneumonia shot. Keep all follow-up visits. Contact a doctor if: You have wheezing, shortness of breath, or a cough even while taking medicine to prevent attacks. The mucus you cough up (sputum) is thicker than usual. The mucus you cough up changes from clear or white to yellow, green, gray, or is bloody. You have problems from the medicine you are taking, such as: A rash. Itching. Swelling. Trouble breathing. You need reliever medicines more than 2-3 times a week. Your peak flow reading is still at 50-79% of your personal best after following the action plan for 1 hour. You have a fever. Get help right away if: You seem to be worse and are not responding to medicine during an asthma attack. You are short of breath even at rest. You get short of breath when doing very little activity. You have trouble eating, drinking, or talking. You have chest  pain or tightness. You have a fast heartbeat. Your lips or fingernails start to turn blue. You are light-headed or dizzy, or you faint. Your peak flow is less than 50% of your personal best. You feel too tired to breathe normally. These symptoms may be an  emergency. Get help right away. Call 911. Do not wait to see if the symptoms will go away. Do not drive yourself to the hospital. Summary Asthma is a long-term (chronic) condition in which the airways get tight and narrow. An asthma attack can make it hard to breathe. Asthma cannot be cured, but medicines and lifestyle changes can help control it. Make sure you understand how to avoid triggers and how and when to use your medicines. Avoid things that can cause allergy symptoms (allergens). These include animal skin flakes (dander) and pollen from trees or grass. Avoid things that pollute the air. These may include household cleaners, wood smoke, smog, or chemical odors. This information is not intended to replace advice given to you by your health care provider. Make sure you discuss any questions you have with your health care provider. Document Revised: 06/11/2021 Document Reviewed: 06/11/2021 Elsevier Patient Education  2024 [employer].
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
## 2024-10-06 ENCOUNTER — Encounter

## 2024-10-25 ENCOUNTER — Ambulatory Visit: Admitting: Internal Medicine

## 2024-11-15 ENCOUNTER — Ambulatory Visit: Admitting: Internal Medicine

## 2024-12-07 ENCOUNTER — Other Ambulatory Visit

## 2025-06-29 ENCOUNTER — Encounter: Admitting: Internal Medicine

## 2025-07-12 ENCOUNTER — Ambulatory Visit: Admitting: Internal Medicine
